# Patient Record
Sex: Male | Born: 1945 | Race: White | Hispanic: No | Marital: Married | Smoking: Former smoker
Health system: Southern US, Community
[De-identification: ages and names within clinical notes are randomized; demographics above are authoritative.]

## PROBLEM LIST (undated history)

## (undated) DIAGNOSIS — E079 Disorder of thyroid, unspecified: Secondary | ICD-10-CM

## (undated) DIAGNOSIS — I10 Essential (primary) hypertension: Secondary | ICD-10-CM

## (undated) DIAGNOSIS — K219 Gastro-esophageal reflux disease without esophagitis: Secondary | ICD-10-CM

## (undated) HISTORY — DX: Essential (primary) hypertension: I10

## (undated) HISTORY — DX: Gastro-esophageal reflux disease without esophagitis: K21.9

## (undated) HISTORY — DX: Disorder of thyroid, unspecified: E07.9

---

## 2001-09-29 ENCOUNTER — Emergency Department (HOSPITAL_COMMUNITY): Admission: EM | Admit: 2001-09-29 | Discharge: 2001-09-29 | Payer: Self-pay | Admitting: Emergency Medicine

## 2001-09-29 ENCOUNTER — Encounter: Payer: Self-pay | Admitting: Emergency Medicine

## 2001-10-01 ENCOUNTER — Emergency Department (HOSPITAL_COMMUNITY): Admission: EM | Admit: 2001-10-01 | Discharge: 2001-10-01 | Payer: Self-pay | Admitting: Emergency Medicine

## 2001-10-11 ENCOUNTER — Emergency Department (HOSPITAL_COMMUNITY): Admission: EM | Admit: 2001-10-11 | Discharge: 2001-10-11 | Payer: Self-pay | Admitting: Emergency Medicine

## 2005-11-01 ENCOUNTER — Encounter: Admission: RE | Admit: 2005-11-01 | Discharge: 2005-11-01 | Payer: Self-pay | Admitting: Cardiology

## 2006-07-03 ENCOUNTER — Encounter (INDEPENDENT_AMBULATORY_CARE_PROVIDER_SITE_OTHER): Payer: Self-pay | Admitting: Specialist

## 2006-07-03 ENCOUNTER — Ambulatory Visit (HOSPITAL_COMMUNITY): Admission: RE | Admit: 2006-07-03 | Discharge: 2006-07-03 | Payer: Self-pay | Admitting: General Surgery

## 2006-12-12 HISTORY — PX: COLONOSCOPY: SHX174

## 2006-12-12 HISTORY — PX: POLYPECTOMY: SHX149

## 2007-03-07 ENCOUNTER — Ambulatory Visit: Payer: Self-pay | Admitting: Gastroenterology

## 2007-04-19 ENCOUNTER — Ambulatory Visit: Payer: Self-pay | Admitting: Gastroenterology

## 2007-04-19 ENCOUNTER — Encounter (INDEPENDENT_AMBULATORY_CARE_PROVIDER_SITE_OTHER): Payer: Self-pay | Admitting: Gastroenterology

## 2008-01-21 ENCOUNTER — Encounter: Admission: RE | Admit: 2008-01-21 | Discharge: 2008-01-21 | Payer: Self-pay | Admitting: Family Medicine

## 2008-12-12 HISTORY — PX: CHOLECYSTECTOMY: SHX55

## 2010-12-21 ENCOUNTER — Encounter (HOSPITAL_COMMUNITY)
Admission: RE | Admit: 2010-12-21 | Discharge: 2011-01-11 | Payer: Self-pay | Source: Home / Self Care | Attending: Endocrinology | Admitting: Endocrinology

## 2011-01-01 ENCOUNTER — Encounter: Payer: Self-pay | Admitting: Endocrinology

## 2011-01-02 ENCOUNTER — Encounter: Payer: Self-pay | Admitting: Endocrinology

## 2011-01-07 ENCOUNTER — Ambulatory Visit (HOSPITAL_COMMUNITY)
Admission: RE | Admit: 2011-01-07 | Discharge: 2011-01-07 | Payer: Self-pay | Source: Home / Self Care | Attending: Endocrinology | Admitting: Endocrinology

## 2011-04-29 NOTE — Op Note (Signed)
Kevin Kemp, EARNSHAW                ACCOUNT NO.:  [id number]   MEDICAL RECORD NO.:  [id number]          PATIENT TYPE:  AMB   LOCATION:  DAY                          FACILITY:  [facility]   PHYSICIAN:  Ollen Gross. [name], M.D. DATE OF BIRTH:  [birth year]   DATE OF PROCEDURE:  07/03/2006  DATE OF DISCHARGE:                                 OPERATIVE REPORT   PREOPERATIVE DIAGNOSIS:  Gallstones.   POSTOPERATIVE DIAGNOSES:  Cholecystitis, cholelithiasis.   PROCEDURE:  Laparoscopic cholecystectomy with intraoperative cholangiogram.   SURGEON:  Ollen Gross. [name], M.D.   ASSISTANT:  Lebron Conners, M.D.   ANESTHESIA:  General endotracheal.   PROCEDURE:  After informed consent was obtained, the patient was brought to  the operating room and placed in the supine position on the operating table.  After adequate induction of general anesthesia, the patient's abdomen was  prepped with Betadine and draped in the usual sterile manner.  The area  below the umbilicus was infiltrated 0.25% Marcaine.  A small incision was  made with a 15 blade knife.  This incision was carried down through the  subcutaneous tissue bluntly with a hemostat and [employer] retractors until  the linea alba was identified.  The linea alba was incised with a 15-blade  knife and each side was grasped with Kocher clamps and elevated anteriorly.  The preperitoneal space was then probed bluntly with hemostat until the  peritoneum was opened, and access was gained into the abdominal cavity.  A 0  Vicryl pursestring stitch was placed in the fascia around the opening.  An  Hasson cannula was placed through the opening and anchored in place with the  previously placed 0 Vicryl pursestring stitch.  The abdomen was then  insufflated with carbon dioxide without difficulty.  The patient was placed  in head-up position and rotated slightly with the right side up.  Next, the  laparoscope was inserted through the Hasson cannula, and the right  quadrant  was inspected.  The dome of the gallbladder and liver readily identified.  Next, the epigastric region was infiltrated with 4% Marcaine.  A small  incision was made with a 15-blade knife, and a 10-mm port was placed bluntly  through this incision into the abdominal cavity under direct vision.  Sites  were then chosen laterally on the right side of the abdomen for placement of  5-mm ports.  Each of these areas was infiltrated with 0.25% Marcaine.  Small  stab incisions were made with a 15-blade knife and 5-mm ports were placed  bluntly through these incisions into the abdominal cavity under direct  vision.  A blunt grasper was placed through the lateral-most 5-mm port and  used to grasp the dome of gallbladder and elevate it anteriorly and  superiorly.  Another blunt grasper was placed through the other 5-mm port  and used to retract on the body and neck of the gallbladder.  The duodenum  appeared to have some adhesions to the body of the gallbladder, and these  were taken down sharply with the laparoscopic scissors.  A dissector was  then placed  through the epigastric port, and using the electrocautery, the  peritoneal reflection at the gallbladder neck area was opened.  Blunt  dissection was carried out in this area until the gallbladder neck and  cystic duct junction was readily identified, and a good window was created.  A single clip placed on the gallbladder neck.  Small ductotomy was made just  below the clip.  A 14-gauge Angiocath was then placed percutaneously through  the anterior abdominal wall under direct vision.  A Reddick cholangiogram  catheter was placed through the Angiocath and flushed.  The Reddick catheter  was then placed in the cystic duct and anchored into place with a clip.  A  cholangiogram was obtained, and that showed no filling defects, good  emptying in the duodenum, and adequate length of the cystic duct.  The  anchoring clip and catheters were then  removed from the patient.  Three  clips were placed proximally in the cystic duct, and the duct was divided  between the two sets of clips.  Posterior to this, the cystic artery was  identified with a branch.  Each of these branches was dissected carefully  circumferentially with a blunt dissector until a good window was created.  Two clips were placed proximally and one distally on each of the branches,  and the branches were divided between the two sets of clips.  Next, a  laparoscopic hook cautery device was used to separate the gallbladder from  the liver bed.  Prior to completely detaching the gallbladder from the liver  bed, the liver bed was inspected, and several small bleeding points were  coagulated with the electrocautery.  The gallbladder was then detached the  rest of the way from the liver bed without difficulty.  During the  retraction of the gallbladder, a small tear was made at the liver at the  edge of the falciform.  This was cauterized and at the end of the case was  completely hemostatic.  A laparoscopic bag was inserted through the  epigastric port.  The gallbladder was placed within the bag, and the bag was  sealed.  The abdomen was then irrigated with copious amounts of saline until  the effluent was clear.  Surgicel was placed in the bed of the liver and at  the tear at the falciform ligament.  The laparoscope was then moved to the  epigastric port.  A gallbladder grasper was placed through the [facility]  cannula and used to grasp the open end of the bag.  The bag with the  gallbladder was removed with the [facility] cannula through the infraumbilical  port without difficulty.  The fascial defect was closed with the previously  placed Vicryl pursestring stitch as well as another interrupted 0 Vicryl  figure-of-eight stitch.  The rest of the ports were removed under direct  vision and found to be hemostatic.  The gas was allowed to escape.  The skin incisions were all  closed with interrupted for Monocryl subcuticular  stitches.  Benzoin and Steri-Strips were applied.  The patient tolerated the  procedure well.  At the end of the case, all needle, sponge, and instrument  counts were correct.  The patient was then awakened and taken to the  recovery room in stable condition.      Ollen Gross. [name], M.D.  Electronically Signed     PST/MEDQ  D:  07/03/2006  T:  07/03/2006  Job:  [id number]

## 2011-04-29 NOTE — Assessment & Plan Note (Signed)
[facility]                         GASTROENTEROLOGY OFFICE NOTE   NAME:Kevin Kemp, Kevin Kemp                       MRN:          [id number]  DATE:03/07/2007                            DOB:          09-09-1946    This is a new office visit for Mr. Hemp who has a long history of acid  reflux.  He says Protonix helps.  He does have some intermittent  abdominal pain as well.  He has been having this for a year, no nausea,  vomiting, does not have any diarrhea or constipation, has had no blood  in his stool.  His last upper endoscopy was 2003 at which time he had a  significant hiatal hernia, and he was dilated with a 54 Maloney dilator.  He did have some mild gastritis as well.   FAMILY HISTORY:  Noncontributory.   SOCIAL HISTORY:  Noncontributory.   PAST MEDICAL HISTORY:  Reveals that he does have some hypertension, and  he is status post cholecystectomy, and has otherwise been in good  health.  He does not drink or smoke.   REVIEW OF SYSTEMS:  Noncontributory.   PHYSICAL EXAMINATION:  He is 6 feet, weighs 187, blood pressure 130/70,  pulse 68 and regular.  Neck, heart and extremities are all unremarkable.   IMPRESSION:  1. Gastroesophageal reflux disease with some dysphagia, on Protonix      daily.  2. Hypertension.  3. Abdominal pain over the past year, intermittently, of questionable      etiology.  4. Status post cholecystectomy.   RECOMMENDATIONS:  Keep him on his Protonix, I gave him samples of this.  We scheduled for an upper endo and colonoscopy examination sometime in  the near future.     Ulyess Mort, MD  Electronically Signed    SML/MedQ  DD: 03/07/2007  DT: 03/07/2007  Job #: [id number]   cc:   Tammy R. Collins [place], M.D.

## 2011-06-28 ENCOUNTER — Encounter: Payer: Self-pay | Admitting: Internal Medicine

## 2012-08-30 ENCOUNTER — Encounter: Payer: Self-pay | Admitting: Internal Medicine

## 2014-10-29 ENCOUNTER — Encounter: Payer: Self-pay | Admitting: Internal Medicine

## 2015-01-01 ENCOUNTER — Encounter: Payer: Self-pay | Admitting: Internal Medicine

## 2015-11-20 ENCOUNTER — Encounter: Payer: Self-pay | Admitting: Internal Medicine

## 2016-01-07 ENCOUNTER — Ambulatory Visit (AMBULATORY_SURGERY_CENTER): Payer: Self-pay

## 2016-01-07 VITALS — Ht 72.0 in | Wt 189.0 lb

## 2016-01-07 DIAGNOSIS — Z86010 Personal history of colonic polyps: Secondary | ICD-10-CM

## 2016-01-07 MED ORDER — NA SULFATE-K SULFATE-MG SULF 17.5-3.13-1.6 GM/177ML PO SOLN: 1.0000 | Freq: Once | ORAL | Status: DC

## 2016-01-07 NOTE — Progress Notes (Signed)
No egg or soy allergies Not on home 02 No previous anesthesia complications No diet or weight loss meds 

## 2016-01-21 ENCOUNTER — Encounter: Payer: Self-pay | Admitting: Internal Medicine

## 2016-01-21 ENCOUNTER — Ambulatory Visit (AMBULATORY_SURGERY_CENTER): Payer: Medicare Other | Admitting: Internal Medicine

## 2016-01-21 VITALS — BP 118/69 | HR 39 | Temp 96.9°F | Resp 19 | Ht 72.0 in | Wt 189.0 lb

## 2016-01-21 DIAGNOSIS — Z1211 Encounter for screening for malignant neoplasm of colon: Secondary | ICD-10-CM

## 2016-01-21 DIAGNOSIS — K5730 Diverticulosis of large intestine without perforation or abscess without bleeding: Secondary | ICD-10-CM | POA: Diagnosis not present

## 2016-01-21 DIAGNOSIS — Z86010 Personal history of colonic polyps: Secondary | ICD-10-CM

## 2016-01-21 MED ORDER — SODIUM CHLORIDE 0.9 % IV SOLN: 500.0000 mL | INTRAVENOUS | Status: DC

## 2016-01-21 NOTE — Op Note (Signed)
[facility] [address] [place], [address]   COLONOSCOPY PROCEDURE REPORT  PATIENT: Kevin Kemp, Kevin Kemp  MR#: [id number] BIRTHDATE: 1946-04-14 , 69  yrs. old GENDER: male ENDOSCOPIST: Roxy Cedar, MD REFERRED MW:[id number] Program Recall PROCEDURE DATE:  01/21/2016 PROCEDURE:   Colonoscopy, surveillance First Screening Colonoscopy - Avg.  risk and is 50 yrs.  old or older - No.  Prior Negative Screening - Now for repeat screening. N/A  History of Adenoma - Now for follow-up colonoscopy & has been > or = to 3 yrs.  N/A  Polyps removed today? No Recommend repeat exam, <10 yrs? No ASA CLASS:   Class II INDICATIONS:Surveillance due to prior colonic neoplasia and Patient is not applicable for Colorectal Neoplasm Risk Assessment for this procedure.  . Index examination May 2008 with Dr. Corinda Gubler. Multiple diminutive polyps. Some with pathology (hyperplastic) others without pathology MEDICATIONS: Monitored anesthesia care and Propofol 200 mg IV  DESCRIPTION OF PROCEDURE:   After the risks benefits and alternatives of the procedure were thoroughly explained, informed consent was obtained.  The digital rectal exam revealed no abnormalities of the rectum.   The LB [id number]  endoscope was introduced through the anus and advanced to the cecum, which was identified by both the appendix and ileocecal valve. No adverse events experienced.   The quality of the prep was excellent. (Suprep was used)  The instrument was then slowly withdrawn as the colon was fully examined. Estimated blood loss is zero unless otherwise noted in this procedure report.      COLON FINDINGS: There was mild diverticulosis noted in the ascending colon and sigmoid colon.   The examination was otherwise normal. Retroflexed views revealed no abnormalities. The time to cecum = 2.4 Withdrawal time = 7.8   The scope was withdrawn and the procedure completed. COMPLICATIONS: There were no  immediate complications.  ENDOSCOPIC IMPRESSION: 1.   Mild diverticulosis was noted in the ascending colon and sigmoid colon 2.   The examination was otherwise normal  RECOMMENDATIONS: Continue current colorectal screening recommendations for "routine risk" patients with a repeat colonoscopy in 10 years.  eSigned:  Roxy Cedar, MD 01/21/2016 12:39 PM   cc: The Patient and Kathlen Brunswick, NP [phone])

## 2016-01-21 NOTE — Progress Notes (Signed)
Report to PACU, RN, vss, BBS= Clear.  

## 2016-01-21 NOTE — Progress Notes (Signed)
Patient is a poor historian regarding family history.

## 2016-01-21 NOTE — Patient Instructions (Signed)

## 2016-01-22 ENCOUNTER — Telehealth: Payer: Self-pay

## 2016-01-22 NOTE — Telephone Encounter (Signed)
  Follow up Call-  Call back number 01/21/2016  Post procedure Call Back phone  # [phone]  Permission to leave phone message Yes    Questions answered by his wife.   Patient questions:  Do you have a fever, pain , or abdominal swelling? No. Pain Score  0 *  Have you tolerated food without any problems? Yes.    Have you been able to return to your normal activities? Yes.    Do you have any questions about your discharge instructions: Diet   No. Medications  No. Follow up visit  No.  Do you have questions or concerns about your Care? No.  Actions: * If pain score is 4 or above: No action needed, pain <4.

## 2020-12-09 ENCOUNTER — Emergency Department (HOSPITAL_COMMUNITY): Payer: Medicare Other

## 2020-12-09 ENCOUNTER — Inpatient Hospital Stay (HOSPITAL_COMMUNITY)
Admission: EM | Admit: 2020-12-09 | Discharge: 2020-12-11 | DRG: 392 | Disposition: A | Discharge status: Home / Self Care | Payer: Medicare Other | Source: Ambulatory Visit | Attending: Internal Medicine | Admitting: Internal Medicine

## 2020-12-09 ENCOUNTER — Encounter (HOSPITAL_COMMUNITY): Payer: Self-pay

## 2020-12-09 ENCOUNTER — Other Ambulatory Visit: Payer: Self-pay

## 2020-12-09 DIAGNOSIS — R1031 Right lower quadrant pain: Secondary | ICD-10-CM

## 2020-12-09 DIAGNOSIS — F0280 Dementia in other diseases classified elsewhere without behavioral disturbance: Secondary | ICD-10-CM | POA: Diagnosis present

## 2020-12-09 DIAGNOSIS — Z8249 Family history of ischemic heart disease and other diseases of the circulatory system: Secondary | ICD-10-CM | POA: Diagnosis not present

## 2020-12-09 DIAGNOSIS — Z79899 Other long term (current) drug therapy: Secondary | ICD-10-CM

## 2020-12-09 DIAGNOSIS — F0391 Unspecified dementia with behavioral disturbance: Secondary | ICD-10-CM | POA: Diagnosis not present

## 2020-12-09 DIAGNOSIS — N179 Acute kidney failure, unspecified: Secondary | ICD-10-CM | POA: Diagnosis present

## 2020-12-09 DIAGNOSIS — Z79890 Hormone replacement therapy (postmenopausal): Secondary | ICD-10-CM | POA: Diagnosis not present

## 2020-12-09 DIAGNOSIS — E039 Hypothyroidism, unspecified: Secondary | ICD-10-CM | POA: Diagnosis present

## 2020-12-09 DIAGNOSIS — I10 Essential (primary) hypertension: Secondary | ICD-10-CM | POA: Diagnosis present

## 2020-12-09 DIAGNOSIS — F0390 Unspecified dementia without behavioral disturbance: Secondary | ICD-10-CM | POA: Diagnosis not present

## 2020-12-09 DIAGNOSIS — G309 Alzheimer's disease, unspecified: Secondary | ICD-10-CM | POA: Diagnosis present

## 2020-12-09 DIAGNOSIS — R109 Unspecified abdominal pain: Secondary | ICD-10-CM | POA: Diagnosis present

## 2020-12-09 DIAGNOSIS — K219 Gastro-esophageal reflux disease without esophagitis: Secondary | ICD-10-CM | POA: Diagnosis present

## 2020-12-09 DIAGNOSIS — Z20822 Contact with and (suspected) exposure to covid-19: Secondary | ICD-10-CM | POA: Diagnosis present

## 2020-12-09 DIAGNOSIS — R001 Bradycardia, unspecified: Secondary | ICD-10-CM | POA: Diagnosis present

## 2020-12-09 DIAGNOSIS — Z87891 Personal history of nicotine dependence: Secondary | ICD-10-CM | POA: Diagnosis not present

## 2020-12-09 DIAGNOSIS — E876 Hypokalemia: Secondary | ICD-10-CM | POA: Diagnosis present

## 2020-12-09 DIAGNOSIS — K529 Noninfective gastroenteritis and colitis, unspecified: Secondary | ICD-10-CM | POA: Diagnosis present

## 2020-12-09 DIAGNOSIS — Z825 Family history of asthma and other chronic lower respiratory diseases: Secondary | ICD-10-CM

## 2020-12-09 LAB — CBC
HCT: 37.7 % — ABNORMAL LOW (ref 39.0–52.0)
Hemoglobin: 12.9 g/dL — ABNORMAL LOW (ref 13.0–17.0)
MCH: 30.5 pg (ref 26.0–34.0)
MCHC: 34.2 g/dL (ref 30.0–36.0)
MCV: 89.1 fL (ref 80.0–100.0)
Platelets: 148 10*3/uL — ABNORMAL LOW (ref 150–400)
RBC: 4.23 MIL/uL (ref 4.22–5.81)
RDW: 11.9 % (ref 11.5–15.5)
WBC: 9.8 10*3/uL (ref 4.0–10.5)
nRBC: 0 % (ref 0.0–0.2)

## 2020-12-09 LAB — COMPREHENSIVE METABOLIC PANEL
ALT: 20 U/L (ref 0–44)
AST: 29 U/L (ref 15–41)
Albumin: 3.8 g/dL (ref 3.5–5.0)
Alkaline Phosphatase: 38 U/L (ref 38–126)
Anion gap: 13 (ref 5–15)
BUN: 34 mg/dL — ABNORMAL HIGH (ref 8–23)
CO2: 27 mmol/L (ref 22–32)
Calcium: 9.5 mg/dL (ref 8.9–10.3)
Chloride: 94 mmol/L — ABNORMAL LOW (ref 98–111)
Creatinine, Ser: 2.13 mg/dL — ABNORMAL HIGH (ref 0.61–1.24)
GFR, Estimated: 32 mL/min — ABNORMAL LOW (ref >60)
Glucose, Bld: 118 mg/dL — ABNORMAL HIGH (ref 70–99)
Potassium: 3.7 mmol/L (ref 3.5–5.1)
Sodium: 134 mmol/L — ABNORMAL LOW (ref 135–145)
Total Bilirubin: 1.9 mg/dL — ABNORMAL HIGH (ref 0.3–1.2)
Total Protein: 7.6 g/dL (ref 6.5–8.1)

## 2020-12-09 LAB — URINALYSIS, ROUTINE W REFLEX MICROSCOPIC
Bilirubin Urine: NEGATIVE
Glucose, UA: NEGATIVE mg/dL
Hgb urine dipstick: NEGATIVE
Ketones, ur: NEGATIVE mg/dL
Nitrite: NEGATIVE
Protein, ur: 30 mg/dL — AB
Specific Gravity, Urine: 1.023 (ref 1.005–1.030)
pH: 5 (ref 5.0–8.0)

## 2020-12-09 LAB — LIPASE, BLOOD: Lipase: 19 U/L (ref 11–51)

## 2020-12-09 MED ORDER — SODIUM CHLORIDE 0.9 % IV SOLN: INTRAVENOUS | Status: AC

## 2020-12-09 MED ORDER — ACETAMINOPHEN 325 MG PO TABS: 650.0000 mg | ORAL_TABLET | Freq: Four times a day (QID) | ORAL | Status: DC | PRN

## 2020-12-09 MED ORDER — ACETAMINOPHEN 650 MG RE SUPP: 650.0000 mg | Freq: Four times a day (QID) | RECTAL | Status: DC | PRN

## 2020-12-09 MED ORDER — HEPARIN SODIUM (PORCINE) 5000 UNIT/ML IJ SOLN
5000.0000 [IU] | Freq: Three times a day (TID) | INTRAMUSCULAR | Status: DC
  Administered 2020-12-10: 5000 [IU] via SUBCUTANEOUS
  Filled 2020-12-09 (×4): qty 1

## 2020-12-09 MED ORDER — SODIUM CHLORIDE 0.9 % IV BOLUS
1000.0000 mL | Freq: Once | INTRAVENOUS | Status: AC
  Administered 2020-12-09: 1000 mL via INTRAVENOUS

## 2020-12-09 MED ORDER — PIPERACILLIN-TAZOBACTAM 3.375 G IVPB
3.3750 g | Freq: Three times a day (TID) | INTRAVENOUS | Status: DC
  Administered 2020-12-10 – 2020-12-11 (×4): 3.375 g via INTRAVENOUS
  Filled 2020-12-09 (×5): qty 50

## 2020-12-09 MED ORDER — PIPERACILLIN-TAZOBACTAM 3.375 G IVPB 30 MIN
3.3750 g | Freq: Once | INTRAVENOUS | Status: AC
  Administered 2020-12-09: 3.375 g via INTRAVENOUS
  Filled 2020-12-09: qty 50

## 2020-12-09 NOTE — ED Provider Notes (Signed)
Medical screening examination/treatment/procedure(s) were conducted as a shared visit with non-physician practitioner(s) and myself.  I personally evaluated the patient during the encounter. Briefly, the patient is a 74 y.o. male with history of dementia who presents the ED with abdominal pain.  Poor p.o. intake for last several days.  Having some right lower abdominal pain.  Overall vitals unremarkable.  Urinalysis negative for infection does have an AKI of 2.1.  No white count, no fever.  CT scan abdomen pelvis shows mesenteric inflammatory changes in the right lower quadrant.  Could be some form of ileal jejunal diverticulitis or Meckel's diverticulum.  Given AKI and these changes will start IV fluids, IV antibiotics and touch base with surgery and have admitted to medicine.  This chart was dictated using voice recognition software.  Despite best efforts to proofread,  errors can occur which can change the documentation meaning.     EKG Interpretation None          Virgina Norfolk, DO 12/09/20 2015

## 2020-12-09 NOTE — H&P (Signed)
History and Physical    JERSEY RAVENSCROFT MCN:[id number] DOB: July 06, 1946 DOA: 12/09/2020  PCP: April Manson, NP Patient coming from: Home  Chief Complaint: Abdominal pain, hypotension  HPI: Kevin Kemp is a 74 y.o. male with medical history significant of hypertension, hypothyroidism, GERD, surgical history of cholecystectomy presenting to the ED for evaluation of abdominal pain and hypotension.  Patient's wife reported to ED provider that he has a history of Alzheimer's.  He has been complaining of lower abdominal pain for the past 2 days and not eating/drinking much.  He has not had any episodes of vomiting, however, has had multiple episodes of nonbloody diarrhea.  No recent antibiotic use, no recent change in medications, and no sick contacts reported.  He is fully vaccinated against Covid.  He was seen at urgent care earlier and was sent to the ED to be evaluated due to low blood pressure with systolic in the 100s.  Patient reports right lower quadrant abdominal pain.  No additional history could be obtained from him.  ED Course: Afebrile.  Slightly bradycardic with heart rate 55-60.  Not hypotensive.  Labs showing WBC 9.8, hemoglobin 12.9, hematocrit 37.7, platelet 148K.  Sodium 134, potassium 3.7, chloride 94, bicarb 27, BUN 34, creatinine 2.1, glucose 118.  T bili 1.9, remainder of LFTs normal.  Lipase normal.  UA with negative nitrite, trace amount of leukocytes, 6-10 WBCs, and rare bacteria.  Urine culture pending.  Screening SARS-CoV-2 PCR test pending.  CT abdomen pelvis showing mesenteric inflammatory changes in the right lower quadrant with mildly enlarged lymph nodes.  Mild dilation of adjacent small bowel loops and some form of ileal or jejunal diverticulitis may be present.  Meckel's diverticulum may be a possibility.  General surgery was consulted and saw the patient.  Recommended medical admission for fluids, antibiotics, and serial abdominal exams.  General surgery will continue  to consult.  If pain worsens or patient develops fever/leukocytosis, diagnostic laparoscopy will be considered.  Patient was given Zosyn and 1 L normal saline bolus.  Review of Systems:  All systems reviewed and apart from history of presenting illness, are negative.  Past Medical History:  Diagnosis Date  . GERD (gastroesophageal reflux disease)   . Hypertension   . Thyroid disease     Past Surgical History:  Procedure Laterality Date  . CHOLECYSTECTOMY  2010  . COLONOSCOPY  2008  . POLYPECTOMY  2008     reports that he quit smoking about 7 years ago. His smoking use included cigarettes. He has a 50.00 pack-year smoking history. He has never used smokeless tobacco. He reports that he does not drink alcohol and does not use drugs.  No Known Allergies  Family History  Problem Relation Age of Onset  . Colon cancer Neg Hx   . Asthma Mother   . Heart attack Father   . Heart disease Paternal Uncle     Prior to Admission medications   Medication Sig Start Date End Date Taking? Authorizing Provider  hydrochlorothiazide (HYDRODIURIL) 25 MG tablet Take 25 mg by mouth daily.    [provider]  levothyroxine (SYNTHROID, LEVOTHROID) 50 MCG tablet Take 50 mcg by mouth daily before breakfast.    [provider]  losartan (COZAAR) 100 MG tablet Take 100 mg by mouth daily.    [provider]  Omega-3 Fatty Acids (FISH OIL) 1200 MG CAPS Take by mouth.    [provider]  omeprazole (PRILOSEC) 20 MG capsule Take 20 mg by mouth  daily.    [provider]    Physical Exam: Vitals:   12/09/20 1418 12/09/20 1540 12/09/20 1834  BP: 104/65 111/66 125/67  Pulse: (!) 59 61 (!) 55  Resp: 16 17 16   Temp: 98.6 F (37 C)  98.3 F (36.8 C)  TempSrc: Oral  Oral  SpO2: 100% 99% 98%    Physical Exam Constitutional:      General: He is not in acute distress. HENT:     Head: Normocephalic and atraumatic.  Eyes:     Extraocular Movements:  Extraocular movements intact.     Conjunctiva/sclera: Conjunctivae normal.  Cardiovascular:     Rate and Rhythm: Normal rate and regular rhythm.     Pulses: Normal pulses.  Pulmonary:     Effort: Pulmonary effort is normal. No respiratory distress.     Breath sounds: No wheezing.  Abdominal:     General: Bowel sounds are normal. There is no distension.     Palpations: Abdomen is soft.     Tenderness: There is abdominal tenderness. There is guarding. There is no rebound.     Comments: Tenderness at McBurney's point  Musculoskeletal:        General: No swelling or tenderness.     Cervical back: Normal range of motion and neck supple.  Skin:    General: Skin is warm and dry.  Neurological:     General: No focal deficit present.     Mental Status: He is alert.     Labs on Admission: I have personally reviewed following labs and imaging studies  CBC: Recent Labs  Lab 12/09/20 1420  WBC 9.8  HGB 12.9*  HCT 37.7*  MCV 89.1  PLT 148*   Basic Metabolic Panel: Recent Labs  Lab 12/09/20 1420  NA 134*  K 3.7  CL 94*  CO2 27  GLUCOSE 118*  BUN 34*  CREATININE 2.13*  CALCIUM 9.5   GFR: CrCl cannot be calculated (Unknown ideal weight.). Liver Function Tests: Recent Labs  Lab 12/09/20 1420  AST 29  ALT 20  ALKPHOS 38  BILITOT 1.9*  PROT 7.6  ALBUMIN 3.8   Recent Labs  Lab 12/09/20 1420  LIPASE 19   No results for input(s): AMMONIA in the last 168 hours. Coagulation Profile: No results for input(s): INR, PROTIME in the last 168 hours. Cardiac Enzymes: No results for input(s): CKTOTAL, CKMB, CKMBINDEX, TROPONINI in the last 168 hours. BNP (last 3 results) No results for input(s): PROBNP in the last 8760 hours. HbA1C: No results for input(s): HGBA1C in the last 72 hours. CBG: No results for input(s): GLUCAP in the last 168 hours. Lipid Profile: No results for input(s): CHOL, HDL, LDLCALC, TRIG, CHOLHDL, LDLDIRECT in the last 72 hours. Thyroid Function  Tests: No results for input(s): TSH, T4TOTAL, FREET4, T3FREE, THYROIDAB in the last 72 hours. Anemia Panel: No results for input(s): VITAMINB12, FOLATE, FERRITIN, TIBC, IRON, RETICCTPCT in the last 72 hours. Urine analysis:    Component Value Date/Time   COLORURINE AMBER (A) 12/09/2020 1420   APPEARANCEUR HAZY (A) 12/09/2020 1420   LABSPEC 1.023 12/09/2020 1420   PHURINE 5.0 12/09/2020 1420   GLUCOSEU NEGATIVE 12/09/2020 1420   HGBUR NEGATIVE 12/09/2020 1420   BILIRUBINUR NEGATIVE 12/09/2020 1420   KETONESUR NEGATIVE 12/09/2020 1420   PROTEINUR 30 (A) 12/09/2020 1420   NITRITE NEGATIVE 12/09/2020 1420   LEUKOCYTESUR TRACE (A) 12/09/2020 1420    Radiological Exams on Admission: CT ABDOMEN PELVIS WO CONTRAST  Result Date: 12/09/2020 CLINICAL DATA:  Acute right flank pain. EXAM: CT ABDOMEN AND PELVIS WITHOUT CONTRAST TECHNIQUE: Multidetector CT imaging of the abdomen and pelvis was performed following the standard protocol without IV contrast. COMPARISON:  May 25, 2006. FINDINGS: Lower chest: No acute abnormality. Hepatobiliary: No focal liver abnormality is seen. Status post cholecystectomy. No biliary dilatation. Pancreas: Unremarkable. No pancreatic ductal dilatation or surrounding inflammatory changes. Spleen: Normal in size without focal abnormality. Adrenals/Urinary Tract: Adrenal glands are unremarkable. Kidneys are normal, without renal calculi, focal lesion, or hydronephrosis. Bladder is unremarkable. Stomach/Bowel: Stomach is within normal limits. Appendix appears normal. There is no evidence of bowel obstruction. However, mesenteric inflammatory changes are noted in the right lower quadrant with mildly enlarged lymph nodes present. There does appear to be mild dilatation of adjacent small bowel loops, and some form of ileal or jejunal diverticulitis may be present. Meckel's diverticulum may be a possibility. Vascular/Lymphatic: Atherosclerosis of abdominal aorta is noted without  aneurysm formation. Reproductive: Prostate is unremarkable. Other: No abdominal wall hernia or abnormality. No abdominopelvic ascites. Musculoskeletal: No acute or significant osseous findings. IMPRESSION: 1. Mesenteric inflammatory changes are noted in the right lower quadrant with mildly enlarged lymph nodes present. There does appear to be mild dilatation of adjacent small bowel loops, and some form of ileal or jejunal diverticulitis may be present. Meckel's diverticulum may be a possibility. 2. Aortic atherosclerosis. Aortic Atherosclerosis (ICD10-I70.0). Electronically Signed   By: Lupita Raider M.D.   On: 12/09/2020 19:38    Assessment/Plan Principal Problem:   Abdominal pain Active Problems:   AKI (acute kidney injury) (HCC)   Bradycardia   HTN (hypertension)   Hypothyroidism   Right lower quadrant abdominal pain, diarrhea: Patient here for evaluation of right lower quadrant abdominal pain, poor oral intake, and nonbloody diarrhea.  No fever, tachycardia, or leukocytosis to suggest sepsis. CT abdomen pelvis showing mesenteric inflammatory changes in the right lower quadrant with mildly enlarged lymph nodes.  Mild dilation of adjacent small bowel loops and some form of ileal or jejunal diverticulitis may be present.  Meckel's diverticulum may be a possibility. General surgery was consulted and saw the patient.  Recommended medical admission for fluids, antibiotics, and serial abdominal exams.  General surgery will continue to consult.  If pain worsens or patient develops fever/leukocytosis, diagnostic laparoscopy will be considered. -IV fluid hydration, continue Zosyn, serial abdominal exams.  C. difficile PCR and GI pathogen panel, enteric precautions.  Continue to monitor WBC count.  Check lactic acid level.  AKI: Likely prerenal due to poor oral intake and home hydrochlorothiazide plus losartan use.  BUN 34, creatinine 2.1.  Per care everywhere, creatinine was 1.3 in July 2021.  No recent  labs for comparison. -IV fluid hydration.  Monitor renal function and urine output.  Hold home hydrochlorothiazide and losartan, avoid nephrotoxic agents.  Check urine sodium and creatinine.  Order renal ultrasound.  Bradycardia: Slightly bradycardic with heart rate 55-60.  Not on any AV nodal blocking agent.  -Cardiac monitoring, order EKG, check TSH level  Possible UTI: UA with negative nitrite, trace amount of leukocytes, 6-10 WBCs, and rare bacteria.  No fever, tachycardia, or leukocytosis to suggest sepsis. -Continue Zosyn as mentioned above.  Urine culture pending.  Elevated T bili: T bili 1.9, remainder of LFTs normal.  Bilirubin was previously normal per records under Care Everywhere.  History of cholecystectomy and no biliary dilatation seen on CT. -Check fractionated bilirubin levels  Hypertension -Hold antihypertensives at this time as blood pressure was low at urgent care.  Most recent blood pressure 125/67.  Check orthostatics and continue to monitor closely.  Hypothyroidism -Check TSH level.  Resume Synthroid after pharmacy med rec is done.  GERD -Resume PPI after pharmacy med rec is done.  DVT prophylaxis: Subcutaneous heparin Code Status: Full code Family Communication: No family available at this time. Disposition Plan: Status is: Inpatient  Remains inpatient appropriate because:Ongoing diagnostic testing needed not appropriate for outpatient work up, IV treatments appropriate due to intensity of illness or inability to take PO and Inpatient level of care appropriate due to severity of illness   Dispo: The patient is from: Home              Anticipated d/c is to: Home              Anticipated d/c date is: 3 days              Patient currently is not medically stable to d/c.  The medical decision making on this patient was of high complexity and the patient is at high risk for clinical deterioration, therefore this is a level 3 visit.  John GiovanniVasundhra Trew Sunde MD [facility]  If 7PM-7AM, please contact night-coverage [email or web address]  12/09/2020, 11:39 PM

## 2020-12-09 NOTE — Progress Notes (Signed)
Pharmacy Antibiotic Note  Kevin Kemp is a 74 y.o. male admitted on 12/09/2020 with intra-abdominal infection.  Pharmacy has been consulted for Zosyn dosing. WBC WNL. Noted renal dysfunction. Surgery following.   Plan: Zosyn 3.375G IV q8h to be infused over 4 hours Trend WBC, temp, renal function  F/U infectious work-up  Temp (24hrs), Avg:98.5 F (36.9 C), Min:98.3 F (36.8 C), Max:98.6 F (37 C)  Recent Labs  Lab 12/09/20 1420  WBC 9.8  CREATININE 2.13*    CrCl cannot be calculated (Unknown ideal weight.).    No Known Allergies  Abran Duke, PharmD, BCPS Clinical Pharmacist Phone: [phone]

## 2020-12-09 NOTE — ED Triage Notes (Signed)
Pt accompanied by wife who reports right flank pain since Monday, no n/v. Sent by UC for further eval due to hypotension, BP 104/65 in triage. Pt alert, hx of dementia.

## 2020-12-09 NOTE — ED Provider Notes (Signed)
[name] EMERGENCY DEPARTMENT Provider Note   CSN: [id number] Arrival date & time: 12/09/20  1315     History Chief Complaint  Patient presents with  . Flank Pain    Kevin Kemp is a 74 y.o. male.  74 y.o male with a past medical history of hypertension presents to the ED with a chief complaint of abdominal pain.  Primary history was obtained from the wife as patient does have a history of Alzheimer's.  Wife reports patient has not been eating, drinking, has been eating less at night, all of this again 2 days ago.  She reports patient grabbing at his lower abdomen, was seen at urgent care earlier and had pain with palpation of the right lower quadrant.  She reports multiple episodes of diarrhea from patient yesterday but without any blood in his stool.  Ports patient has not been complaining of anything aside from lack of appetite.  There has been no recent medication changes and patient has a follow-up appointment with his PCP next week.  Has been no blood in his stool, urine,no fever other complaints from patient. Prior history of a cholecystectomy reported.  The history is provided by the patient. The history is limited by the condition of the patient.       Past Medical History:  Diagnosis Date  . GERD (gastroesophageal reflux disease)   . Hypertension   . Thyroid disease     Patient Active Problem List   Diagnosis Date Noted  . Abdominal pain 12/09/2020    Past Surgical History:  Procedure Laterality Date  . CHOLECYSTECTOMY  2010  . COLONOSCOPY  2008  . POLYPECTOMY  2008       Family History  Problem Relation Age of Onset  . Colon cancer Neg Hx   . Asthma Mother   . Heart attack Father   . Heart disease Paternal Uncle     Social History   Tobacco Use  . Smoking status: Former Smoker    Packs/day: 1.00    Years: 50.00    Pack years: 50.00    Types: Cigarettes    Quit date: 01/06/2013    Years since quitting: 7.9  . Smokeless  tobacco: Never Used  Substance Use Topics  . Alcohol use: No    Alcohol/week: 0.0 standard drinks  . Drug use: No    Home Medications Prior to Admission medications   Medication Sig Start Date End Date Taking? Authorizing Provider  hydrochlorothiazide (HYDRODIURIL) 25 MG tablet Take 25 mg by mouth daily.    [provider]  levothyroxine (SYNTHROID, LEVOTHROID) 50 MCG tablet Take 50 mcg by mouth daily before breakfast.    [provider]  losartan (COZAAR) 100 MG tablet Take 100 mg by mouth daily.    [provider]  Omega-3 Fatty Acids (FISH OIL) 1200 MG CAPS Take by mouth.    [provider]  omeprazole (PRILOSEC) 20 MG capsule Take 20 mg by mouth daily.    [provider]    Allergies    Patient has no known allergies.  Review of Systems   Review of Systems  Constitutional: Negative for chills and fever.  HENT: Negative for sore throat.   Respiratory: Negative for shortness of breath.   Cardiovascular: Negative for chest pain.  Gastrointestinal: Positive for abdominal pain, diarrhea and nausea. Negative for blood in stool, constipation and vomiting.  Genitourinary: Negative for dysuria, flank pain and hematuria.  Musculoskeletal: Negative for back pain.  All  other systems reviewed and are negative.   Physical Exam Updated Vital Signs BP 125/67 (BP Location: Left Arm)   Pulse (!) 55   Temp 98.3 F (36.8 C) (Oral)   Resp 16   SpO2 98%   Physical Exam Vitals and nursing note reviewed.  Constitutional:      General: He is not in acute distress.    Appearance: Normal appearance. He is not ill-appearing.  HENT:     Head: Normocephalic and atraumatic.     Mouth/Throat:     Mouth: Mucous membranes are moist.  Pulmonary:     Effort: Pulmonary effort is normal.     Breath sounds: No wheezing or rales.  Abdominal:     General: Abdomen is flat.     Tenderness: There is abdominal tenderness in the right lower quadrant and  suprapubic area. There is rebound. There is no right CVA tenderness, left CVA tenderness or guarding. Positive signs include McBurney's sign.     Comments: Bowel sounds are diminished.  There is tenderness to palpation along the right lower quadrant and suprapubic region.  No guarding noted but some rebound tenderness.  Musculoskeletal:        General: No tenderness or deformity.     Cervical back: Normal range of motion and neck supple.  Skin:    General: Skin is warm and dry.  Neurological:     Mental Status: He is alert and oriented to person, place, and time.     ED Results / Procedures / Treatments   Labs (all labs ordered are listed, but only abnormal results are displayed) Labs Reviewed  URINALYSIS, ROUTINE W REFLEX MICROSCOPIC - Abnormal; Notable for the following components:      Result Value   Color, Urine AMBER (*)    APPearance HAZY (*)    Protein, ur 30 (*)    Leukocytes,Ua TRACE (*)    Bacteria, UA RARE (*)    All other components within normal limits  CBC - Abnormal; Notable for the following components:   Hemoglobin 12.9 (*)    HCT 37.7 (*)    Platelets 148 (*)    All other components within normal limits  COMPREHENSIVE METABOLIC PANEL - Abnormal; Notable for the following components:   Sodium 134 (*)    Chloride 94 (*)    Glucose, Bld 118 (*)    BUN 34 (*)    Creatinine, Ser 2.13 (*)    Total Bilirubin 1.9 (*)    GFR, Estimated 32 (*)    All other components within normal limits  URINE CULTURE  SARS CORONAVIRUS 2 (TAT 6-24 HRS)  LIPASE, BLOOD    EKG None  Radiology CT ABDOMEN PELVIS WO CONTRAST  Result Date: 12/09/2020 CLINICAL DATA:  Acute right flank pain. EXAM: CT ABDOMEN AND PELVIS WITHOUT CONTRAST TECHNIQUE: Multidetector CT imaging of the abdomen and pelvis was performed following the standard protocol without IV contrast. COMPARISON:  May 25, 2006. FINDINGS: Lower chest: No acute abnormality. Hepatobiliary: No focal liver abnormality is seen.  Status post cholecystectomy. No biliary dilatation. Pancreas: Unremarkable. No pancreatic ductal dilatation or surrounding inflammatory changes. Spleen: Normal in size without focal abnormality. Adrenals/Urinary Tract: Adrenal glands are unremarkable. Kidneys are normal, without renal calculi, focal lesion, or hydronephrosis. Bladder is unremarkable. Stomach/Bowel: Stomach is within normal limits. Appendix appears normal. There is no evidence of bowel obstruction. However, mesenteric inflammatory changes are noted in the right lower quadrant with mildly enlarged lymph nodes present. There does appear to be mild  dilatation of adjacent small bowel loops, and some form of ileal or jejunal diverticulitis may be present. Meckel's diverticulum may be a possibility. Vascular/Lymphatic: Atherosclerosis of abdominal aorta is noted without aneurysm formation. Reproductive: Prostate is unremarkable. Other: No abdominal wall hernia or abnormality. No abdominopelvic ascites. Musculoskeletal: No acute or significant osseous findings. IMPRESSION: 1. Mesenteric inflammatory changes are noted in the right lower quadrant with mildly enlarged lymph nodes present. There does appear to be mild dilatation of adjacent small bowel loops, and some form of ileal or jejunal diverticulitis may be present. Meckel's diverticulum may be a possibility. 2. Aortic atherosclerosis. Aortic Atherosclerosis (ICD10-I70.0). Electronically Signed   By: Lupita Raider M.D.   On: 12/09/2020 19:38    Procedures Procedures (including critical care time)  Medications Ordered in ED Medications  piperacillin-tazobactam (ZOSYN) IVPB 3.375 g (has no administration in time range)  sodium chloride 0.9 % bolus 1,000 mL (1,000 mLs Intravenous New Bag/Given 12/09/20 1951)    ED Course  I have reviewed the triage vital signs and the nursing notes.  Pertinent labs & imaging results that were available during my care of the patient were reviewed by me and  considered in my medical decision making (see chart for details).  Clinical Course as of 12/09/20 2109  Wed Dec 09, 2020  1825 Leukocytes,Ua(!): TRACE [JS]  1825 Bacteria, UA(!): RARE [JS]  1906 Creatinine(!): 2.13 [JS]    Clinical Course User Index [JS] Claude Manges, PA-C   MDM Rules/Calculators/A&P   Patient with a past medical history of hypertension presents to the ED with a chief complaint of abdominal pain and hypotension.  Primary history obtained from wife as patient has a history of Alzheimer's.  He was seen at urgent care earlier, recommended to be seen in the ED due to hypotension with pressures with a systolic in the 100s.  Cording to wife patient has been complaining of lower abdominal pain for the past 2 days, has had a lack of appetite not drinking and eating.  He has not had any episodes of vomiting however multiple episodes of diarrhea, none of these with blood.  No recent antibiotic use, no recent change in medications, no sick contacts.  His surgical history of the abdomen remarkable for cholecystectomy.  During arrival to the ED patient's vitals are within normal limits, blood pressure is normotensive and he is afebrile satting at 98% on room air.  No recent URI symptoms per wife.  According to wife at the bedside, patient had a colonoscopy 3 years ago which did not show any signs of diverticulosis or polyps.  Physical examination revealed tenderness along the right lower quadrant and suprapubic region along McBurney's sign.  CVA tender noticed bilaterally.  Bowel sounds are slightly diminished.  Lungs are clear to auscultation, no tenderness with chest palpation.  Differential diagnoses included but not limited to ileus, appendicitis, gastroenteritis, diverticulitis.  Interpretation of his labs revealed a CBC without any leukocytosis, slight decrease in hemoglobin but stable.  CMP remarkable for mild hyponatremia, BUN is elevated, creatinine level significant for an AKI, no  prior labs for comparison, patient without any history of kidney disease.  LFTs are within normal limits.  UA remarkable for trace of leukocytes, rare bacteria, white count 6-10, will send this for culture at this time.   CT Abdomen showed: 1. Mesenteric inflammatory changes are noted in the right lower  quadrant with mildly enlarged lymph nodes present. There does appear  to be mild dilatation of adjacent small bowel loops,  and some form  of ileal or jejunal diverticulitis may be present. Meckel's  diverticulum may be a possibility.  2. Aortic atherosclerosis.     8:22 PM Spoke to Dr. Fredricka Bonine of general surgery who recommended fluids, antibiotics will evaluate patient while in the ED. Patient will need admission via hospitalist service at this time. I have discussed case with my attending Dr. Lockie Mola who has informed family of results and agrees with plan and management at this time.   9:06 PM Spoke to Dr. Loney Loh who will admit patient for further management. Of note patient is vaccinated and boosted per wife at the bedside.    Portions of this note were generated with Scientist, clinical (histocompatibility and immunogenetics). Dictation errors may occur despite best attempts at proofreading.  Final Clinical Impression(s) / ED Diagnoses Final diagnoses:  Right lower quadrant pain    Rx / DC Orders ED Discharge Orders    None       Claude Manges, Cordelia Poche 12/09/20 2109    Virgina Norfolk, DO 12/09/20 2326

## 2020-12-09 NOTE — Consult Note (Signed)
Surgical Evaluation Requesting provider: Claude Manges PA-C  Chief Complaint: abdominal pain  HPI: 74 year old man with history of hypertension, GERD who presents to the ER with a 3-day history of abdominal pain.  History is provided by the patient's wife and chart review as the patient has significant Alzheimer's and is unable to give a history.  She states that he does have intermittent issues with vague abdominal pain for many years but usually she can give him a Tylenol and it will resolve.  Beginning on Monday, she noted that he was complaining of abdominal pain and she also noted that his appetite was decreased compared to his normal.  She tried to give him Tylenol and it seemed a little bit better but not completely resolved.  He did have a normal loose bowel movement on Tuesday which was nonbloody.  Continued to have some decreased appetite, and continued to complain of pain and so this morning he was brought to an urgent care where the examiner noted tenderness in the right lower quadrant as well as relative hypotension with systolic blood pressure in the 100s and referred him to the [facility] ER for further work-up.  He has not had any fevers, no emesis or complaint of nausea.  Reportedly he had a colonoscopy 3 years ago which was negative for diverticulosis or polyps. On arrival to the [facility] ER, the patient's vital signs were normal.  Labs and imaging as below.   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   No Known Allergies  Past Medical History:  Diagnosis Date  . GERD (gastroesophageal reflux disease)   . Hypertension   . Thyroid disease     Past Surgical History:  Procedure Laterality Date  . CHOLECYSTECTOMY  2010  . COLONOSCOPY  2008  . POLYPECTOMY  2008    Family History  Problem Relation Age of Onset  . Colon cancer Neg Hx   . Asthma Mother   . Heart attack Father   . Heart disease Paternal Uncle     Social History   Socioeconomic History   . Marital status: Married    Spouse name: Not on file  . Number of children: Not on file  . Years of education: Not on file  . Highest education level: Not on file  Occupational History  . Not on file  Tobacco Use  . Smoking status: Former Smoker    Packs/day: 1.00    Years: 50.00    Pack years: 50.00    Types: Cigarettes    Quit date: 01/06/2013    Years since quitting: 7.9  . Smokeless tobacco: Never Used  Substance and Sexual Activity  . Alcohol use: No    Alcohol/week: 0.0 standard drinks  . Drug use: No  . Sexual activity: Not on file  Other Topics Concern  . Not on file  Social History Narrative  . Not on file   Social Determinants of Health   Financial Resource Strain: Not on file  Food Insecurity: Not on file  Transportation Needs: Not on file  Physical Activity: Not on file  Stress: Not on file  Social Connections: Not on file    No current facility-administered medications on file prior to encounter.   Current Outpatient Medications on File Prior to Encounter  Medication Sig Dispense Refill  . hydrochlorothiazide (HYDRODIURIL) 25 MG tablet Take 25 mg by mouth daily.    Marland Kitchen levothyroxine (SYNTHROID, LEVOTHROID) 50 MCG tablet Take 50 mcg by mouth daily before  breakfast.    . losartan (COZAAR) 100 MG tablet Take 100 mg by mouth daily.    . Omega-3 Fatty Acids (FISH OIL) 1200 MG CAPS Take by mouth.    Marland Kitchen omeprazole (PRILOSEC) 20 MG capsule Take 20 mg by mouth daily.      Review of Systems: a complete, 10pt review of systems was unable to be completed due to patient mental status  Physical Exam: Vitals:   12/09/20 1540 12/09/20 1834  BP: 111/66 125/67  Pulse: 61 (!) 55  Resp: 17 16  Temp:  98.3 F (36.8 C)  SpO2: 99% 98%   Gen: Alert, confused, cooperative Eyes: lids and conjunctivae normal, no icterus. Pupils equally round and reactive to light.  Neck: supple without mass or thyromegaly Chest: respiratory effort is normal. No crepitus or tenderness on  palpation of the chest. Breath sounds equal.  Cardiovascular: RRR with palpable distal pulses, no pedal edema Gastrointestinal: soft, nondistended, focally tender in the right lower quadrant with voluntary guarding. Muscoloskeletal: no clubbing or cyanosis of the fingers.  Strength is symmetrical throughout.  Range of motion of bilateral upper and lower extremities normal without pain, crepitation or contracture. Neuro: Patient is confused, unable to appropriately answer questions or compose a coherent statement. Cranial nerves grossly intact.  Sensation intact to light touch diffusely. Skin: warm and dry, no rash or lesion on limited exam   CBC Latest Ref Rng & Units 12/09/2020  WBC 4.0 - 10.5 K/uL 9.8  Hemoglobin 13.0 - 17.0 g/dL 12.9(L)  Hematocrit 39.0 - 52.0 % 37.7(L)  Platelets 150 - 400 K/uL 148(L)    CMP Latest Ref Rng & Units 12/09/2020  Glucose 70 - 99 mg/dL 355(D)  BUN 8 - 23 mg/dL 32(K)  Creatinine 0.25 - 1.24 mg/dL 4.27(C)  Sodium 623 - 762 mmol/L 134(L)  Potassium 3.5 - 5.1 mmol/L 3.7  Chloride 98 - 111 mmol/L 94(L)  CO2 22 - 32 mmol/L 27  Calcium 8.9 - 10.3 mg/dL 9.5  Total Protein 6.5 - 8.1 g/dL 7.6  Total Bilirubin 0.3 - 1.2 mg/dL 8.3(T)  Alkaline Phos 38 - 126 U/L 38  AST 15 - 41 U/L 29  ALT 0 - 44 U/L 20    No results found for: INR, PROTIME  Imaging: CT ABDOMEN PELVIS WO CONTRAST  Result Date: 12/09/2020 CLINICAL DATA:  Acute right flank pain. EXAM: CT ABDOMEN AND PELVIS WITHOUT CONTRAST TECHNIQUE: Multidetector CT imaging of the abdomen and pelvis was performed following the standard protocol without IV contrast. COMPARISON:  May 25, 2006. FINDINGS: Lower chest: No acute abnormality. Hepatobiliary: No focal liver abnormality is seen. Status post cholecystectomy. No biliary dilatation. Pancreas: Unremarkable. No pancreatic ductal dilatation or surrounding inflammatory changes. Spleen: Normal in size without focal abnormality. Adrenals/Urinary Tract: Adrenal  glands are unremarkable. Kidneys are normal, without renal calculi, focal lesion, or hydronephrosis. Bladder is unremarkable. Stomach/Bowel: Stomach is within normal limits. Appendix appears normal. There is no evidence of bowel obstruction. However, mesenteric inflammatory changes are noted in the right lower quadrant with mildly enlarged lymph nodes present. There does appear to be mild dilatation of adjacent small bowel loops, and some form of ileal or jejunal diverticulitis may be present. Meckel's diverticulum may be a possibility. Vascular/Lymphatic: Atherosclerosis of abdominal aorta is noted without aneurysm formation. Reproductive: Prostate is unremarkable. Other: No abdominal wall hernia or abnormality. No abdominopelvic ascites. Musculoskeletal: No acute or significant osseous findings. IMPRESSION: 1. Mesenteric inflammatory changes are noted in the right lower quadrant with mildly enlarged lymph  nodes present. There does appear to be mild dilatation of adjacent small bowel loops, and some form of ileal or jejunal diverticulitis may be present. Meckel's diverticulum may be a possibility. 2. Aortic atherosclerosis. Aortic Atherosclerosis (ICD10-I70.0). Electronically Signed   By: Lupita Raider M.D.   On: 12/09/2020 19:38     A/P: 74 year old man with 3-day history of abdominal pain and decreased appetite.  I have reviewed his CT images personally, mesenteric inflammatory changes notable in the right lower quadrant with mild mesenteric lymphadenopathy, small bowel diverticulitis or Meckel's "may be a possibility". I would add mesenteric adenitis to the differential. He also has AKI. Recommend admission for fluid resuscitation, serial exams/labs, empiric antibiotics. Surgery will follow closely. If pain worsens or develops fever/leukocytosis, will consider diagnostic laparoscopy. His wife is his HCPOA.        Phylliss Blakes, MD [facility], [place]  See AMION to contact appropriate  on-call provider

## 2020-12-10 ENCOUNTER — Encounter (HOSPITAL_COMMUNITY): Payer: Self-pay | Admitting: Internal Medicine

## 2020-12-10 ENCOUNTER — Inpatient Hospital Stay (HOSPITAL_COMMUNITY): Payer: Medicare Other

## 2020-12-10 DIAGNOSIS — N179 Acute kidney failure, unspecified: Secondary | ICD-10-CM | POA: Diagnosis not present

## 2020-12-10 DIAGNOSIS — R1031 Right lower quadrant pain: Secondary | ICD-10-CM | POA: Diagnosis not present

## 2020-12-10 DIAGNOSIS — F0390 Unspecified dementia without behavioral disturbance: Secondary | ICD-10-CM

## 2020-12-10 DIAGNOSIS — R109 Unspecified abdominal pain: Secondary | ICD-10-CM

## 2020-12-10 DIAGNOSIS — R001 Bradycardia, unspecified: Secondary | ICD-10-CM

## 2020-12-10 HISTORY — DX: Unspecified abdominal pain: R10.9

## 2020-12-10 LAB — BASIC METABOLIC PANEL
Anion gap: 12 (ref 5–15)
BUN: 31 mg/dL — ABNORMAL HIGH (ref 8–23)
CO2: 28 mmol/L (ref 22–32)
Calcium: 9.2 mg/dL (ref 8.9–10.3)
Chloride: 96 mmol/L — ABNORMAL LOW (ref 98–111)
Creatinine, Ser: 1.72 mg/dL — ABNORMAL HIGH (ref 0.61–1.24)
GFR, Estimated: 42 mL/min — ABNORMAL LOW (ref >60)
Glucose, Bld: 100 mg/dL — ABNORMAL HIGH (ref 70–99)
Potassium: 2.9 mmol/L — ABNORMAL LOW (ref 3.5–5.1)
Sodium: 136 mmol/L (ref 135–145)

## 2020-12-10 LAB — LACTIC ACID, PLASMA: Lactic Acid, Venous: 1.3 mmol/L (ref 0.5–1.9)

## 2020-12-10 LAB — HEPATIC FUNCTION PANEL
ALT: 39 U/L (ref 0–44)
AST: 59 U/L — ABNORMAL HIGH (ref 15–41)
Albumin: 3.6 g/dL (ref 3.5–5.0)
Alkaline Phosphatase: 42 U/L (ref 38–126)
Bilirubin, Direct: 0.5 mg/dL — ABNORMAL HIGH (ref 0.0–0.2)
Indirect Bilirubin: 1.4 mg/dL — ABNORMAL HIGH (ref 0.3–0.9)
Total Bilirubin: 1.9 mg/dL — ABNORMAL HIGH (ref 0.3–1.2)
Total Protein: 7.5 g/dL (ref 6.5–8.1)

## 2020-12-10 LAB — CBC
HCT: 38.8 % — ABNORMAL LOW (ref 39.0–52.0)
Hemoglobin: 12.8 g/dL — ABNORMAL LOW (ref 13.0–17.0)
MCH: 29.6 pg (ref 26.0–34.0)
MCHC: 33 g/dL (ref 30.0–36.0)
MCV: 89.6 fL (ref 80.0–100.0)
Platelets: 154 10*3/uL (ref 150–400)
RBC: 4.33 MIL/uL (ref 4.22–5.81)
RDW: 11.7 % (ref 11.5–15.5)
WBC: 9.4 10*3/uL (ref 4.0–10.5)
nRBC: 0 % (ref 0.0–0.2)

## 2020-12-10 LAB — URINE CULTURE: Culture: <10000 — AB

## 2020-12-10 LAB — TSH: TSH: 2.197 u[IU]/mL (ref 0.350–4.500)

## 2020-12-10 LAB — SARS CORONAVIRUS 2 (TAT 6-24 HRS): SARS Coronavirus 2: NEGATIVE

## 2020-12-10 MED ORDER — LORAZEPAM 2 MG/ML IJ SOLN
1.0000 mg | Freq: Four times a day (QID) | INTRAMUSCULAR | Status: DC
  Administered 2020-12-10 – 2020-12-11 (×3): 1 mg via INTRAVENOUS
  Filled 2020-12-10 (×4): qty 1

## 2020-12-10 NOTE — ED Notes (Signed)
Pt sitting calmly at nurses station.

## 2020-12-10 NOTE — ED Notes (Signed)
Patient to be transported upstairs with all belongings.   Wife at bedside.

## 2020-12-10 NOTE — ED Notes (Signed)
Pt keeps disconnecting self from monitor and Iv fluid. Pt will not stay in bed and is rambling. Sitter ordered.

## 2020-12-10 NOTE — ED Notes (Signed)
Pt not tolerating VS to be taken at this time. Pt will not allow for cardiac monitoring.

## 2020-12-10 NOTE — ED Notes (Signed)
Wife with patient.  No issues noted

## 2020-12-10 NOTE — Progress Notes (Addendum)
Patient has become increasingly confused, agitated and combative, patient is not easily orientated,refusing to sit down, ripping off tele. MD notified, tele-sitter ordered. 1MG  of ativan administered per order. Will continue to closely monitor.

## 2020-12-10 NOTE — Progress Notes (Signed)
Subjective: Patient is incredibly demented.  He can not give [place] any information and will not appropriately answer any of our questions.  ROS: unable due to severe dementia  Objective: Vital signs in last 24 hours: Temp:  [98.3 F (36.8 C)-98.6 F (37 C)] 98.5 F (36.9 C) (12/30 0757) Pulse Rate:  [52-61] 52 (12/30 0757) Resp:  [14-18] 16 (12/30 0757) BP: (100-129)/(47-67) 129/54 (12/30 0757) SpO2:  [97 %-100 %] 100 % (12/30 0757)    Intake/Output from previous day: 12/29 0701 - 12/30 0700 In: 1031.2 [IV Piggyback:1031.2] Out: -  Intake/Output this shift: No intake/output data recorded.  PE: GEN: demented but pleasant Abd: soft, mild tenderness in RLQ, +BS, ND  Lab Results:  Recent Labs    12/09/20 1420 12/10/20 0420  WBC 9.8 9.4  HGB 12.9* 12.8*  HCT 37.7* 38.8*  PLT 148* 154   BMET Recent Labs    12/09/20 1420 12/10/20 0420  NA 134* 136  K 3.7 2.9*  CL 94* 96*  CO2 27 28  GLUCOSE 118* 100*  BUN 34* 31*  CREATININE 2.13* 1.72*  CALCIUM 9.5 9.2   PT/INR No results for input(s): LABPROT, INR in the last 72 hours. CMP     Component Value Date/Time   NA 136 12/10/2020 0420   K 2.9 (L) 12/10/2020 0420   CL 96 (L) 12/10/2020 0420   CO2 28 12/10/2020 0420   GLUCOSE 100 (H) 12/10/2020 0420   BUN 31 (H) 12/10/2020 0420   CREATININE 1.72 (H) 12/10/2020 0420   CALCIUM 9.2 12/10/2020 0420   PROT 7.5 12/10/2020 0420   ALBUMIN 3.6 12/10/2020 0420   AST 59 (H) 12/10/2020 0420   ALT 39 12/10/2020 0420   ALKPHOS 42 12/10/2020 0420   BILITOT 1.9 (H) 12/10/2020 0420   GFRNONAA 42 (L) 12/10/2020 0420   Lipase     Component Value Date/Time   LIPASE 19 12/09/2020 1420       Studies/Results: CT ABDOMEN PELVIS WO CONTRAST  Result Date: 12/09/2020 CLINICAL DATA:  Acute right flank pain. EXAM: CT ABDOMEN AND PELVIS WITHOUT CONTRAST TECHNIQUE: Multidetector CT imaging of the abdomen and pelvis was performed following the standard protocol without  IV contrast. COMPARISON:  May 25, 2006. FINDINGS: Lower chest: No acute abnormality. Hepatobiliary: No focal liver abnormality is seen. Status post cholecystectomy. No biliary dilatation. Pancreas: Unremarkable. No pancreatic ductal dilatation or surrounding inflammatory changes. Spleen: Normal in size without focal abnormality. Adrenals/Urinary Tract: Adrenal glands are unremarkable. Kidneys are normal, without renal calculi, focal lesion, or hydronephrosis. Bladder is unremarkable. Stomach/Bowel: Stomach is within normal limits. Appendix appears normal. There is no evidence of bowel obstruction. However, mesenteric inflammatory changes are noted in the right lower quadrant with mildly enlarged lymph nodes present. There does appear to be mild dilatation of adjacent small bowel loops, and some form of ileal or jejunal diverticulitis may be present. Meckel's diverticulum may be a possibility. Vascular/Lymphatic: Atherosclerosis of abdominal aorta is noted without aneurysm formation. Reproductive: Prostate is unremarkable. Other: No abdominal wall hernia or abnormality. No abdominopelvic ascites. Musculoskeletal: No acute or significant osseous findings. IMPRESSION: 1. Mesenteric inflammatory changes are noted in the right lower quadrant with mildly enlarged lymph nodes present. There does appear to be mild dilatation of adjacent small bowel loops, and some form of ileal or jejunal diverticulitis may be present. Meckel's diverticulum may be a possibility. 2. Aortic atherosclerosis. Aortic Atherosclerosis (ICD10-I70.0). Electronically Signed   By: Zenda Alpers.D.  On: 12/09/2020 19:38   US RENAL  Result Date: 12/10/2020 CLINICAL DATA:  Acute kidney injury EXAM: RENAL / URINARY TRACT ULTRASOUND COMPLETE COMPARISON:  None. FINDINGS: Right Kidney: Renal measurements: 10.7 x 5.2 x 4.0 cm = volume: 115 mL. Echogenicity within normal limits. No mass or hydronephrosis visualized. Left Kidney: Renal measurements:  10.5 x 5.2 x 4.8 cm = volume: 136 mL. Echogenicity within normal limits. Within the lower pole of the left kidney there is a small anechoic cyst measuring 1.3 x 1.3 cm. Bladder: Appears normal for degree of bladder distention. Other: None. IMPRESSION: Normal appearing right kidney. Left renal cyst. Electronically Signed   By: Jonna Clark M.D.   On: 12/10/2020 00:21    Anti-infectives: Anti-infectives (From admission, onward)   Start     Dose/Rate Route Frequency Ordered Stop   12/10/20 0600  piperacillin-tazobactam (ZOSYN) IVPB 3.375 g        3.375 g 12.5 mL/hr over 240 Minutes Intravenous Every 8 hours 12/09/20 2344     12/09/20 2030  piperacillin-tazobactam (ZOSYN) IVPB 3.375 g        3.375 g 100 mL/hr over 30 Minutes Intravenous  Once 12/09/20 2020 12/09/20 2258       Assessment/Plan Dementia AKI - improving Bradycardia Possible UTI HTN Hypokalemia - per medicine  RLQ abdominal pain, mesenteric inflammation -WBC is normal today -vitals stable -still has some RLQ abdominal but certainly no guarding or peritonitis -cont NPO -cont abx therapy -we will follow closely  FEN - NPO/IVFs VTE - heparin subq ID - zosyn   LOS: 1 day    Letha Cape , [facility] Surgery 12/10/2020, 9:52 AM Please see Amion for pager number during day hours 7:00am-4:30pm or 7:00am -11:30am on weekends

## 2020-12-10 NOTE — Progress Notes (Signed)
PROGRESS NOTE  Kevin Kemp YQI:[id number] DOB: 10-23-1946 DOA: 12/09/2020 PCP: April Manson, NP  HPI/Recap of past 48 hours: 74 year old male with past medical history of hypertension, hypothyroidism, Alzheimer's dementia brought in to the emergency room on 12/29 with complaints of abdominal pain and hypotension.  According to wife who was present, patient had been having lower abdominal pain for the past few days and eating/drinking very little.  No episodes of vomiting reported, but did have multiple episodes of diarrhea.  Vaccinated against Covid.  Emergency room, noted to have systolic in the 100s, normal white count, bradycardic and acute kidney injury with BUN 34, creatinine 2.1.  CT of the abdomen pelvis noted mesenteric inflammatory changes in the right lower quadrant with mildly enlarged lymph nodes and mild dilatation of adjacent small bowel loops and some form of ileal or jejunal diverticulitis may be present with possibility of Meckel's diverticulum.  Seen by general surgery and with normal white count, fluids, antibiotics and serial abdominal exams to follow.  Lactic acid was unremarkable.  Overnight no issues.  Lab work this morning notes improvement in patient's creatinine down to 1.72 with white blood cell count still staying normal.  Patient himself does not report any abdominal pain although with his dementia it is very hard to get him to answer questions appropriately.  Discussed with surgery and trying trial of clear liquids.  Assessment/Plan: Principal Problem:   Abdominal pain with possibility of gastroenteritis versus mesenteritis versus small intestine diverticulitis: Being followed by general surgery.  Continue antibiotics.  We will try clear liquids.  White count staying normal so far.  Repeat labs in the morning and if he is still tolerating p.o. and white count normal, could potentially discharge. Active Problems:   AKI (acute kidney injury) [facility]): Improving with IV  fluids, secondary to poor p.o. intake and diarrhea.    Bradycardia: TSH normal.  Not on any beta-blocker.  Continue to monitor.  Asymptomatic.   HTN (hypertension): Blood pressure initially slightly soft, improved with fluids.    Hypothyroidism: Stable, TSH normal.  Continue Synthroid.    Senile dementia uncomp, without behavioral disturbance (HCC): Stable although dementia is advanced and he is quite mobile.  Fortunately, wife able to stay with him.   Code Status: Full code  Family Communication: Wife at bedside  Disposition Plan: Potential discharge tomorrow if white count normal, exam remains benign and tolerating liquids.   Consultants:  General surgery  Procedures:  None  Antimicrobials:  IV Zosyn 12/29-present  DVT prophylaxis: Subcu heparin   Objective: Vitals:   12/10/20 1134 12/10/20 1443  BP: (!) 108/54 130/69  Pulse: (!) 51 (!) 46  Resp: 20 18  Temp:  98 F (36.7 C)  SpO2: 99% 100%    Intake/Output Summary (Last 24 hours) at 12/10/2020 1551 Last data filed at 12/10/2020 1304 Gross per 24 hour  Intake 2224.95 ml  Output --  Net 2224.95 ml   Filed Weights   12/10/20 1443  Weight: 74.8 kg   Body mass index is 22.37 kg/m.  Exam:   General: Awake, oriented x1  HEENT: Normocephalic and atraumatic mucous membranes slightly dry  Cardiovascular: Regular rate and rhythm, S1-S2  Respiratory: Clear to auscultation bilaterally  Abdomen: Soft, appears to be nontender, nondistended, hypoactive bowel sounds  Musculoskeletal: No clubbing or cyanosis or edema  Skin: No skin breaks, tears or lesions  Psychiatry: Advanced dementia   Data Reviewed: CBC: Recent Labs  Lab 12/09/20 1420 12/10/20 0420  WBC 9.8 9.4  HGB 12.9* 12.8*  HCT 37.7* 38.8*  MCV 89.1 89.6  PLT 148* 154   Basic Metabolic Panel: Recent Labs  Lab 12/09/20 1420 12/10/20 0420  NA 134* 136  K 3.7 2.9*  CL 94* 96*  CO2 27 28  GLUCOSE 118* 100*  BUN 34* 31*   CREATININE 2.13* 1.72*  CALCIUM 9.5 9.2   GFR: CrCl cannot be calculated (Unknown ideal weight.). Liver Function Tests: Recent Labs  Lab 12/09/20 1420 12/10/20 0420  AST 29 59*  ALT 20 39  ALKPHOS 38 42  BILITOT 1.9* 1.9*  PROT 7.6 7.5  ALBUMIN 3.8 3.6   Recent Labs  Lab 12/09/20 1420  LIPASE 19   No results for input(s): AMMONIA in the last 168 hours. Coagulation Profile: No results for input(s): INR, PROTIME in the last 168 hours. Cardiac Enzymes: No results for input(s): CKTOTAL, CKMB, CKMBINDEX, TROPONINI in the last 168 hours. BNP (last 3 results) No results for input(s): PROBNP in the last 8760 hours. HbA1C: No results for input(s): HGBA1C in the last 72 hours. CBG: No results for input(s): GLUCAP in the last 168 hours. Lipid Profile: No results for input(s): CHOL, HDL, LDLCALC, TRIG, CHOLHDL, LDLDIRECT in the last 72 hours. Thyroid Function Tests: Recent Labs    12/10/20 0029  TSH 2.197   Anemia Panel: No results for input(s): VITAMINB12, FOLATE, FERRITIN, TIBC, IRON, RETICCTPCT in the last 72 hours. Urine analysis:    Component Value Date/Time   COLORURINE AMBER (A) 12/09/2020 1420   APPEARANCEUR HAZY (A) 12/09/2020 1420   LABSPEC 1.023 12/09/2020 1420   PHURINE 5.0 12/09/2020 1420   GLUCOSEU NEGATIVE 12/09/2020 1420   HGBUR NEGATIVE 12/09/2020 1420   BILIRUBINUR NEGATIVE 12/09/2020 1420   KETONESUR NEGATIVE 12/09/2020 1420   PROTEINUR 30 (A) 12/09/2020 1420   NITRITE NEGATIVE 12/09/2020 1420   LEUKOCYTESUR TRACE (A) 12/09/2020 1420   Sepsis Labs: @LABRCNTIP (procalcitonin:4,lacticidven:4)  ) Recent Results (from the past 240 hour(s))  SARS CORONAVIRUS 2 (TAT 6-24 HRS) Nasopharyngeal Nasopharyngeal Swab     Status: None   Collection Time: 12/09/20  9:55 PM   Specimen: Nasopharyngeal Swab  Result Value Ref Range Status   SARS Coronavirus 2 NEGATIVE NEGATIVE Final    Comment: (NOTE) SARS-CoV-2 target nucleic acids are NOT DETECTED.  The  SARS-CoV-2 RNA is generally detectable in upper and lower respiratory specimens during the acute phase of infection. Negative results do not preclude SARS-CoV-2 infection, do not rule out co-infections with other pathogens, and should not be used as the sole basis for treatment or other patient management decisions. Negative results must be combined with clinical observations, patient history, and epidemiological information. The expected result is Negative.  Fact Sheet for Patients: 12/11/20  Fact Sheet for Healthcare Providers: HairSlick.no  This test is not yet approved or cleared by the quierodirigir.com FDA and  has been authorized for detection and/or diagnosis of SARS-CoV-2 by FDA under an Emergency Use Authorization (EUA). This EUA will remain  in effect (meaning this test can be used) for the duration of the COVID-19 declaration under Se ction 564(b)(1) of the Act, 21 U.S.C. section 360bbb-3(b)(1), unless the authorization is terminated or revoked sooner.  Performed at [facility], [address] [place], [place]       Studies: CT ABDOMEN PELVIS WO CONTRAST  Result Date: 12/09/2020 CLINICAL DATA:  Acute right flank pain. EXAM: CT ABDOMEN AND PELVIS WITHOUT CONTRAST TECHNIQUE: Multidetector CT imaging of the abdomen and pelvis was performed following the  standard protocol without IV contrast. COMPARISON:  May 25, 2006. FINDINGS: Lower chest: No acute abnormality. Hepatobiliary: No focal liver abnormality is seen. Status post cholecystectomy. No biliary dilatation. Pancreas: Unremarkable. No pancreatic ductal dilatation or surrounding inflammatory changes. Spleen: Normal in size without focal abnormality. Adrenals/Urinary Tract: Adrenal glands are unremarkable. Kidneys are normal, without renal calculi, focal lesion, or hydronephrosis. Bladder is unremarkable. Stomach/Bowel: Stomach is within normal  limits. Appendix appears normal. There is no evidence of bowel obstruction. However, mesenteric inflammatory changes are noted in the right lower quadrant with mildly enlarged lymph nodes present. There does appear to be mild dilatation of adjacent small bowel loops, and some form of ileal or jejunal diverticulitis may be present. Meckel's diverticulum may be a possibility. Vascular/Lymphatic: Atherosclerosis of abdominal aorta is noted without aneurysm formation. Reproductive: Prostate is unremarkable. Other: No abdominal wall hernia or abnormality. No abdominopelvic ascites. Musculoskeletal: No acute or significant osseous findings. IMPRESSION: 1. Mesenteric inflammatory changes are noted in the right lower quadrant with mildly enlarged lymph nodes present. There does appear to be mild dilatation of adjacent small bowel loops, and some form of ileal or jejunal diverticulitis may be present. Meckel's diverticulum may be a possibility. 2. Aortic atherosclerosis. Aortic Atherosclerosis (ICD10-I70.0). Electronically Signed   By: Lupita Raider M.D.   On: 12/09/2020 19:38   US RENAL  Result Date: 12/10/2020 CLINICAL DATA:  Acute kidney injury EXAM: RENAL / URINARY TRACT ULTRASOUND COMPLETE COMPARISON:  None. FINDINGS: Right Kidney: Renal measurements: 10.7 x 5.2 x 4.0 cm = volume: 115 mL. Echogenicity within normal limits. No mass or hydronephrosis visualized. Left Kidney: Renal measurements: 10.5 x 5.2 x 4.8 cm = volume: 136 mL. Echogenicity within normal limits. Within the lower pole of the left kidney there is a small anechoic cyst measuring 1.3 x 1.3 cm. Bladder: Appears normal for degree of bladder distention. Other: None. IMPRESSION: Normal appearing right kidney. Left renal cyst. Electronically Signed   By: Jonna Clark M.D.   On: 12/10/2020 00:21    Scheduled Meds: . heparin  5,000 Units Subcutaneous Q8H    Continuous Infusions: . piperacillin-tazobactam (ZOSYN)  IV Stopped (12/10/20 0559)      LOS: 1 day     Hollice Espy, MD [facility]   12/10/2020, 3:51 PM

## 2020-12-10 NOTE — ED Notes (Signed)
Pt got up out of bed again, pt is now seated in front of the nurses station

## 2020-12-10 NOTE — ED Notes (Signed)
Pt disrobed and attempting to leave room. Redirected back to bed.

## 2020-12-10 NOTE — Progress Notes (Signed)
NEW ADMISSION NOTE New Admission Note:   Arrival Method: wheelchair Mental Orientation:  Telemetry:5M04 Assessment: Completed Skin:intact IV:LFA Pain:none Tubes:none Safety Measures: Safety Fall Prevention Plan has been given, discussed and signed Admission: Completed [facility] Orientation: Patient has been orientated to the room, unit and staff.  Family:wife at bedside  Orders have been reviewed and implemented. Will continue to monitor the patient. Call light has been placed within reach and bed alarm has been activated.   Katelan Hirt S Kristopher Attwood, RN

## 2020-12-11 DIAGNOSIS — N179 Acute kidney failure, unspecified: Secondary | ICD-10-CM | POA: Diagnosis not present

## 2020-12-11 DIAGNOSIS — K529 Noninfective gastroenteritis and colitis, unspecified: Principal | ICD-10-CM

## 2020-12-11 DIAGNOSIS — I10 Essential (primary) hypertension: Secondary | ICD-10-CM | POA: Diagnosis not present

## 2020-12-11 DIAGNOSIS — F0391 Unspecified dementia with behavioral disturbance: Secondary | ICD-10-CM | POA: Diagnosis not present

## 2020-12-11 LAB — BASIC METABOLIC PANEL
Anion gap: 12 (ref 5–15)
BUN: 21 mg/dL (ref 8–23)
CO2: 27 mmol/L (ref 22–32)
Calcium: 8.9 mg/dL (ref 8.9–10.3)
Chloride: 98 mmol/L (ref 98–111)
Creatinine, Ser: 1.39 mg/dL — ABNORMAL HIGH (ref 0.61–1.24)
GFR, Estimated: 54 mL/min — ABNORMAL LOW (ref >60)
Glucose, Bld: 81 mg/dL (ref 70–99)
Potassium: 3.1 mmol/L — ABNORMAL LOW (ref 3.5–5.1)
Sodium: 137 mmol/L (ref 135–145)

## 2020-12-11 LAB — CBC
HCT: 32.4 % — ABNORMAL LOW (ref 39.0–52.0)
Hemoglobin: 11.4 g/dL — ABNORMAL LOW (ref 13.0–17.0)
MCH: 30.7 pg (ref 26.0–34.0)
MCHC: 35.2 g/dL (ref 30.0–36.0)
MCV: 87.3 fL (ref 80.0–100.0)
Platelets: 153 10*3/uL (ref 150–400)
RBC: 3.71 MIL/uL — ABNORMAL LOW (ref 4.22–5.81)
RDW: 11.7 % (ref 11.5–15.5)
WBC: 5.8 10*3/uL (ref 4.0–10.5)
nRBC: 0 % (ref 0.0–0.2)

## 2020-12-11 NOTE — Progress Notes (Signed)
Progress Note: General Surgery Service   Chief Complaint/Subjective: Mr. Kevin Kemp is demented and cannot provide a history  Objective: Vital signs in last 24 hours: Temp:  [98 F (36.7 C)-98.7 F (37.1 C)] 98.7 F (37.1 C) (12/31 0856) Pulse Rate:  [46-71] 71 (12/31 0856) Resp:  [16-18] 16 (12/31 0856) BP: (99-130)/(48-71) 121/71 (12/31 0856) SpO2:  [98 %-100 %] 98 % (12/31 0856) Weight:  [74.8 kg] 74.8 kg (12/30 1443) Last BM Date: 12/09/20  Intake/Output from previous day: 12/30 0701 - 12/31 0700 In: 1306.6 [I.V.:1193.8; IV Piggyback:112.9] Out: -  Intake/Output this shift: No intake/output data recorded.  Gen: Resting comfortably in bed  Resp: No increased work of breathing  Card: Rate and rhythm  Abd:, Nontender, nondistended  Lab Results: CBC  Recent Labs    12/10/20 0420 12/11/20 0335  WBC 9.4 5.8  HGB 12.8* 11.4*  HCT 38.8* 32.4*  PLT 154 153   BMET Recent Labs    12/10/20 0420 12/11/20 0335  NA 136 137  K 2.9* 3.1*  CL 96* 98  CO2 28 27  GLUCOSE 100* 81  BUN 31* 21  CREATININE 1.72* 1.39*  CALCIUM 9.2 8.9   PT/INR No results for input(s): LABPROT, INR in the last 72 hours. ABG No results for input(s): PHART, HCO3 in the last 72 hours.  Invalid input(s): PCO2, PO2  Anti-infectives: Anti-infectives (From admission, onward)   Start     Dose/Rate Route Frequency Ordered Stop   12/10/20 0600  piperacillin-tazobactam (ZOSYN) IVPB 3.375 g        3.375 g 12.5 mL/hr over 240 Minutes Intravenous Every 8 hours 12/09/20 2344     12/09/20 2030  piperacillin-tazobactam (ZOSYN) IVPB 3.375 g        3.375 g 100 mL/hr over 30 Minutes Intravenous  Once 12/09/20 2020 12/09/20 2258      Medications: Scheduled Meds: . heparin  5,000 Units Subcutaneous Q8H  . LORazepam  1 mg Intravenous Q6H   Continuous Infusions: . piperacillin-tazobactam (ZOSYN)  IV 3.375 g (12/11/20 0518)   PRN Meds:.acetaminophen **OR** acetaminophen  Assessment/Plan: Mr.  Kevin Kemp is demented and cannot provide a history, we were consulted due to abnormal findings on CT scan including some edema in the mesentery in the right lower quadrant.  After 24 hours of observation his white count remains normal and his belly remains nontender.  It would be reasonable to discharge patient today and follow-up with [place] as needed.   LOS: 2 days   Kevin Ore, MD [phone] [facility]

## 2020-12-11 NOTE — Discharge Instructions (Signed)

## 2020-12-11 NOTE — Discharge Summary (Signed)
Discharge Summary  Kevin Kemp JSH:[id number] DOB: 15-Nov-1946  PCP: April Manson, NP  Admit date: 12/09/2020 Discharge date: 12/11/2020  Time spent: 25 minutes  Recommendations for Outpatient Follow-up:  1. Patient will follow up with his PCP in approximately 2 to 3 weeks  Discharge Diagnoses:  Active Hospital Problems   Diagnosis Date Noted  . Abdominal pain 12/09/2020  . Senile dementia uncomp, without behavioral disturbance (HCC) 12/10/2020  . AKI (acute kidney injury) (HCC) 12/09/2020  . Bradycardia 12/09/2020  . HTN (hypertension) 12/09/2020  . Hypothyroidism 12/09/2020    Resolved Hospital Problems  No resolved problems to display.    Discharge Condition: Improved, being discharged home  Diet recommendation: Low-sodium  Vitals:   12/11/20 0446 12/11/20 0856  BP: (!) 99/48 121/71  Pulse: (!) 55 71  Resp: 16 16  Temp: 98.4 F (36.9 C) 98.7 F (37.1 C)  SpO2: 98% 98%    History of present illness:  74 year old male with past medical history of hypertension, hypothyroidism, Alzheimer's dementia brought in to the emergency room on 12/29 with complaints of abdominal pain and hypotension.  According to wife who was present, patient had been having lower abdominal pain for the past few days and eating/drinking very little.  No episodes of vomiting reported, but did have multiple episodes of diarrhea.  Vaccinated against Covid.  Emergency room, noted to have systolic in the 100s, normal white count, bradycardic and acute kidney injury with BUN 34, creatinine 2.1.  CT of the abdomen pelvis noted mesenteric inflammatory changes in the right lower quadrant with mildly enlarged lymph nodes and mild dilatation of adjacent small bowel loops and some form of ileal or jejunal diverticulitis may be present with possibility of Meckel's diverticulum.  Seen by general surgery and with normal white count, fluids, antibiotics and serial abdominal exams to follow.  Lactic acid was  unremarkable.  Hospital Course:    Abdominal pain with possibility of gastroenteritis versus mesenteritis versus small intestine diverticulitis: By following day, lab work noted improving creatinine and normal white blood cell count.  No fever.  Patient's dementia made it very difficult to assess if he was having any abdominal pain.  Diet was started initially on clear liquids which he tolerated.  By 12/31, tolerating full liquids without any incident.  Repeat labs noted continued normalization of white blood cell count.  Given his advanced dementia and agitation from being in the hospital, and his ability to tolerate food and normal white count, is felt that patient likely had an enteritis that has since passed.  Given no fever or white count, discharged home.  Does not need antibiotics. Active Problems:   AKI (acute kidney injury) [facility]): Improved well with IV fluids.  By day of discharge down to 1.39.  Should continue to improve with p.o. intake.    Bradycardia: TSH normal.  Not on any beta-blocker.  Continue to monitor.  Asymptomatic.   HTN (hypertension): Blood pressure initially slightly soft, improved with fluids.    Hypothyroidism: Stable, TSH normal.  Continue Synthroid.    Senile dementia uncomp, with behavioral disturbance (HCC): Initially was calm, but as patient stayed more in the hospital became much more agitated, especially at night.  Required Ativan to Keep him calm.  Him returning home will help tremendously   Procedures:  None  Consultations:  General surgery  Discharge Exam: BP 121/71 (BP Location: Right Arm)   Pulse 71   Temp 98.7 F (37.1 C) (Oral)   Resp 16   Wt  74.8 kg   SpO2 98%   BMI 22.37 kg/m   General: Underlying chronic dementia, currently calm Cardiovascular: Regular rate and rhythm, S1-S2 Respiratory: Clear to auscultation bilaterally  Discharge Instructions You were cared for by a hospitalist during your hospital stay. If you have any  questions about your discharge medications or the care you received while you were in the hospital after you are discharged, you can call the unit and asked to speak with the hospitalist on call if the hospitalist that took care of you is not available. Once you are discharged, your primary care physician will handle any further medical issues. Please note that NO REFILLS for any discharge medications will be authorized once you are discharged, as it is imperative that you return to your primary care physician (or establish a relationship with a primary care physician if you do not have one) for your aftercare needs so that they can reassess your need for medications and monitor your lab values.  Discharge Instructions    Diet - low sodium heart healthy   Complete by: As directed    Increase activity slowly   Complete by: As directed      Allergies as of 12/11/2020   No Known Allergies     Medication List    TAKE these medications   acetaminophen 500 MG tablet Commonly known as: TYLENOL Take 500 mg by mouth every 6 (six) hours as needed for moderate pain or fever.   hydrochlorothiazide 25 MG tablet Commonly known as: HYDRODIURIL Take 25 mg by mouth daily.   levocetirizine 5 MG tablet Commonly known as: XYZAL Take 5 mg by mouth at bedtime.   levothyroxine 50 MCG tablet Commonly known as: SYNTHROID Take 50 mcg by mouth as directed. Take 1 tablet 6 Days A Week (Mon thru Sat)   losartan 100 MG tablet Commonly known as: COZAAR Take 100 mg by mouth at bedtime.   pantoprazole 40 MG tablet Commonly known as: PROTONIX Take 40 mg by mouth daily.   rosuvastatin 5 MG tablet Commonly known as: CRESTOR Take 5 mg by mouth at bedtime.      No Known Allergies  Follow-up Information    Kathlen Brunswick L, NP Follow up in 3 week(s).   Specialty: Family Medicine Contact information: [phone] [address] [place] [address] [phone]                The results of  significant diagnostics from this hospitalization (including imaging, microbiology, ancillary and laboratory) are listed below for reference.    Significant Diagnostic Studies: CT ABDOMEN PELVIS WO CONTRAST  Result Date: 12/09/2020 CLINICAL DATA:  Acute right flank pain. EXAM: CT ABDOMEN AND PELVIS WITHOUT CONTRAST TECHNIQUE: Multidetector CT imaging of the abdomen and pelvis was performed following the standard protocol without IV contrast. COMPARISON:  May 25, 2006. FINDINGS: Lower chest: No acute abnormality. Hepatobiliary: No focal liver abnormality is seen. Status post cholecystectomy. No biliary dilatation. Pancreas: Unremarkable. No pancreatic ductal dilatation or surrounding inflammatory changes. Spleen: Normal in size without focal abnormality. Adrenals/Urinary Tract: Adrenal glands are unremarkable. Kidneys are normal, without renal calculi, focal lesion, or hydronephrosis. Bladder is unremarkable. Stomach/Bowel: Stomach is within normal limits. Appendix appears normal. There is no evidence of bowel obstruction. However, mesenteric inflammatory changes are noted in the right lower quadrant with mildly enlarged lymph nodes present. There does appear to be mild dilatation of adjacent small bowel loops, and some form of ileal or jejunal diverticulitis may be present. Meckel's  diverticulum may be a possibility. Vascular/Lymphatic: Atherosclerosis of abdominal aorta is noted without aneurysm formation. Reproductive: Prostate is unremarkable. Other: No abdominal wall hernia or abnormality. No abdominopelvic ascites. Musculoskeletal: No acute or significant osseous findings. IMPRESSION: 1. Mesenteric inflammatory changes are noted in the right lower quadrant with mildly enlarged lymph nodes present. There does appear to be mild dilatation of adjacent small bowel loops, and some form of ileal or jejunal diverticulitis may be present. Meckel's diverticulum may be a possibility. 2. Aortic atherosclerosis.  Aortic Atherosclerosis (ICD10-I70.0). Electronically Signed   By: Lupita Raider M.D.   On: 12/09/2020 19:38   US RENAL  Result Date: 12/10/2020 CLINICAL DATA:  Acute kidney injury EXAM: RENAL / URINARY TRACT ULTRASOUND COMPLETE COMPARISON:  None. FINDINGS: Right Kidney: Renal measurements: 10.7 x 5.2 x 4.0 cm = volume: 115 mL. Echogenicity within normal limits. No mass or hydronephrosis visualized. Left Kidney: Renal measurements: 10.5 x 5.2 x 4.8 cm = volume: 136 mL. Echogenicity within normal limits. Within the lower pole of the left kidney there is a small anechoic cyst measuring 1.3 x 1.3 cm. Bladder: Appears normal for degree of bladder distention. Other: None. IMPRESSION: Normal appearing right kidney. Left renal cyst. Electronically Signed   By: Jonna Clark M.D.   On: 12/10/2020 00:21    Microbiology: Recent Results (from the past 240 hour(s))  Urine culture     Status: Abnormal   Collection Time: 12/09/20  2:20 PM   Specimen: Urine, Random  Result Value Ref Range Status   Specimen Description URINE, RANDOM  Final   Special Requests NONE  Final   Culture (A)  Final    <10,000 COLONIES/mL INSIGNIFICANT GROWTH Performed at [facility], [address], [place], [place] [address]    Report Status 12/10/2020 FINAL  Final  SARS CORONAVIRUS 2 (TAT 6-24 HRS) Nasopharyngeal Nasopharyngeal Swab     Status: None   Collection Time: 12/09/20  9:55 PM   Specimen: Nasopharyngeal Swab  Result Value Ref Range Status   SARS Coronavirus 2 NEGATIVE NEGATIVE Final    Comment: (NOTE) SARS-CoV-2 target nucleic acids are NOT DETECTED.  The SARS-CoV-2 RNA is generally detectable in upper and lower respiratory specimens during the acute phase of infection. Negative results do not preclude SARS-CoV-2 infection, do not rule out co-infections with other pathogens, and should not be used as the sole basis for treatment or other patient management decisions. Negative results must be combined with  clinical observations, patient history, and epidemiological information. The expected result is Negative.  Fact Sheet for Patients: HairSlick.no  Fact Sheet for Healthcare Providers: quierodirigir.com  This test is not yet approved or cleared by the [place] FDA and  has been authorized for detection and/or diagnosis of SARS-CoV-2 by FDA under an Emergency Use Authorization (EUA). This EUA will remain  in effect (meaning this test can be used) for the duration of the COVID-19 declaration under Se ction 564(b)(1) of the Act, 21 U.S.C. section 360bbb-3(b)(1), unless the authorization is terminated or revoked sooner.  Performed at [facility], [address] [place], [place] [address]      Labs: Basic Metabolic Panel: Recent Labs  Lab 12/09/20 1420 12/10/20 0420 12/11/20 0335  NA 134* 136 137  K 3.7 2.9* 3.1*  CL 94* 96* 98  CO2 27 28 27   GLUCOSE 118* 100* 81  BUN 34* 31* 21  CREATININE 2.13* 1.72* 1.39*  CALCIUM 9.5 9.2 8.9   Liver Function Tests: Recent Labs  Lab 12/09/20 1420 12/10/20 0420  AST 29 59*  ALT 20 39  ALKPHOS 38 42  BILITOT 1.9* 1.9*  PROT 7.6 7.5  ALBUMIN 3.8 3.6   Recent Labs  Lab 12/09/20 1420  LIPASE 19   No results for input(s): AMMONIA in the last 168 hours. CBC: Recent Labs  Lab 12/09/20 1420 12/10/20 0420 12/11/20 0335  WBC 9.8 9.4 5.8  HGB 12.9* 12.8* 11.4*  HCT 37.7* 38.8* 32.4*  MCV 89.1 89.6 87.3  PLT 148* 154 153   Cardiac Enzymes: No results for input(s): CKTOTAL, CKMB, CKMBINDEX, TROPONINI in the last 168 hours. BNP: BNP (last 3 results) No results for input(s): BNP in the last 8760 hours.  ProBNP (last 3 results) No results for input(s): PROBNP in the last 8760 hours.  CBG: No results for input(s): GLUCAP in the last 168 hours.     Signed:  Hollice EspySendil K Davionna Blacksher, MD [facility] 12/11/2020, 12:47 PM

## 2020-12-11 NOTE — TOC Transition Note (Signed)
Transition of Care [facility]) - CM/SW Discharge Note   Patient Details  Name: Kevin Kemp MRN: [id number] Date of Birth: Jan 18, 1946  Transition of Care [facility]) CM/SW Contact:  Kermit Balo, RN Phone Number: 12/11/2020, 1:30 PM   Clinical Narrative:    Pt is discharging home with self care. No needs per TOC.   Final next level of care: Home/Self Care Barriers to Discharge: No Barriers Identified   Patient Goals and CMS Choice        Discharge Placement                       Discharge Plan and Services                                     Social Determinants of Health (SDOH) Interventions     Readmission Risk Interventions No flowsheet data found.

## 2021-12-04 IMAGING — CT CT ABD-PELV W/O CM
2 of 4 series · 16 of 46 positions shown, 18 images · non-contrast
Comparison: May 25, 2006.

CLINICAL DATA: Acute right flank pain.

EXAM:
CT ABDOMEN AND PELVIS WITHOUT CONTRAST
TECHNIQUE: Multidetector CT imaging of the abdomen and pelvis was performed
following the standard protocol without IV contrast.

[Series 3: abd/ pelvis 5.0 i30f 2 · axial · 0.79mm/px · z∈[+612,+1042]mm · 13 of 94 slices shown, 15 images]
[im 4/94  soft-tissue]
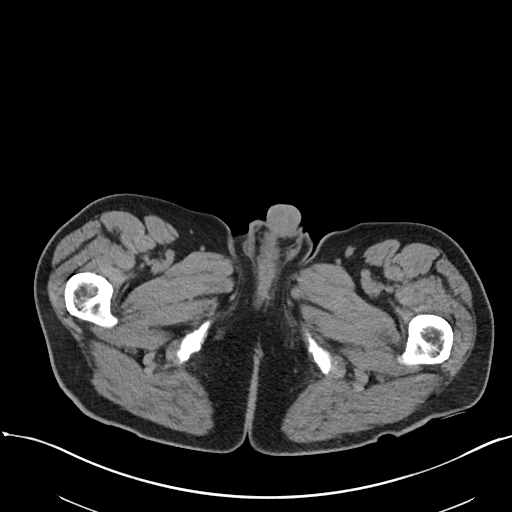
[im 4/94  bone]
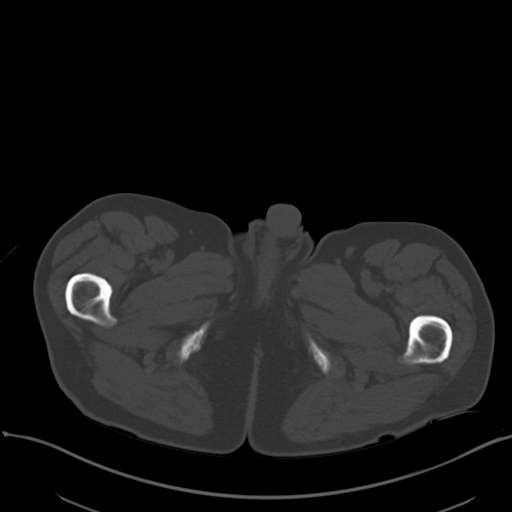
[im 12/94  soft-tissue]
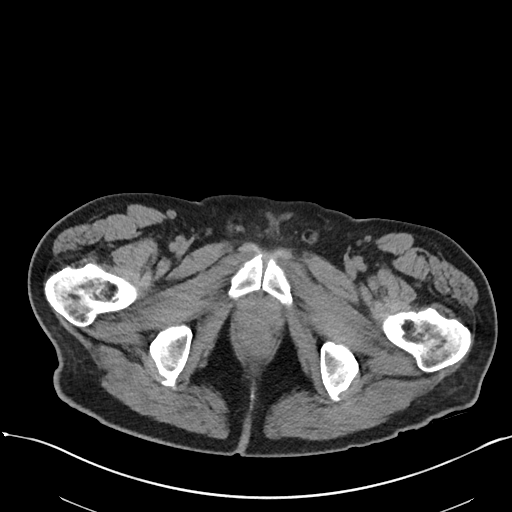
[im 20/94  soft-tissue]
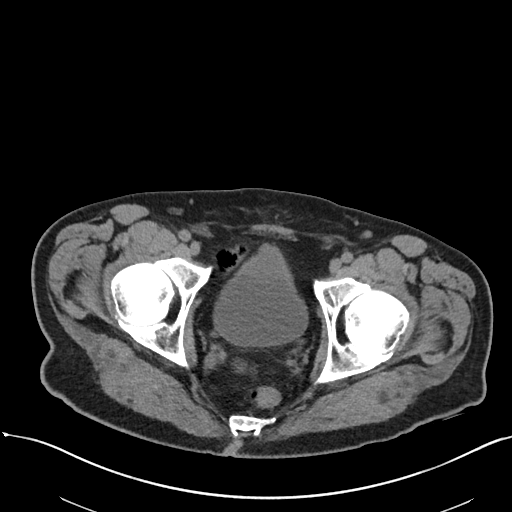
[im 28/94  soft-tissue]
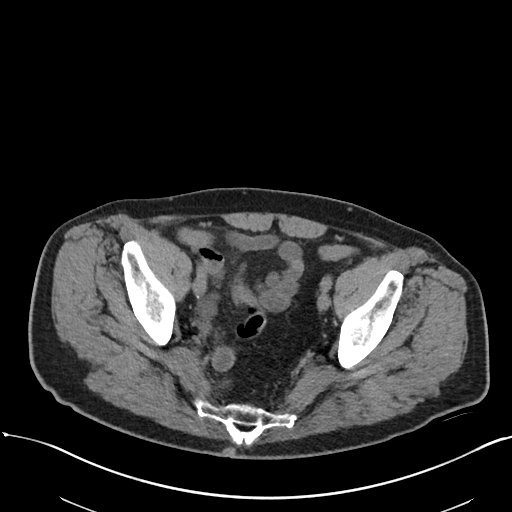
[im 32/94  soft-tissue]
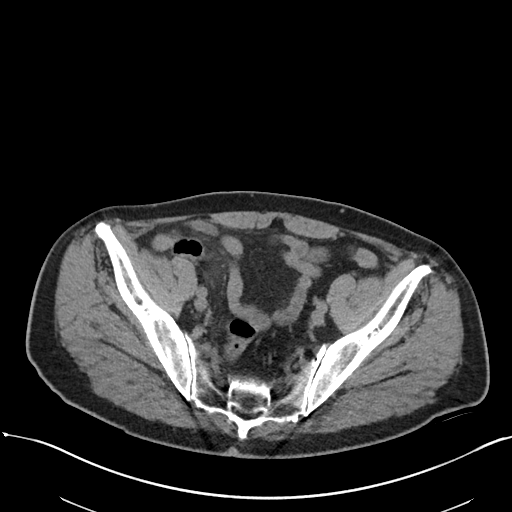
[im 39/94  soft-tissue]
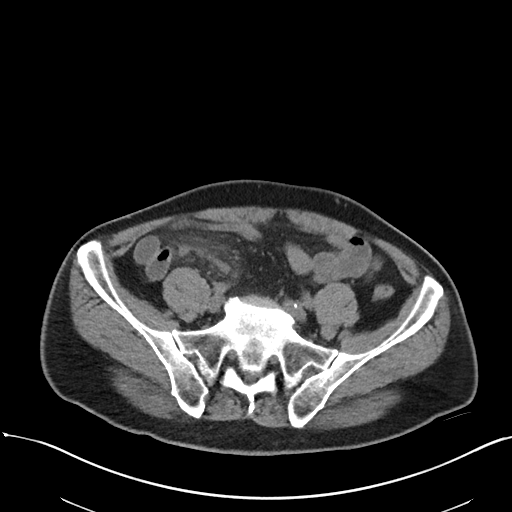
[im 47/94  soft-tissue]
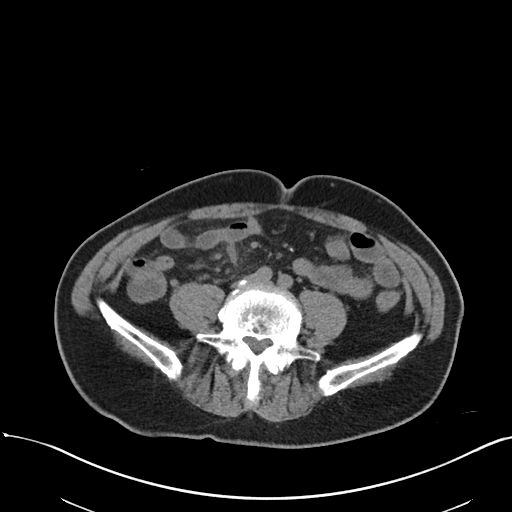
[im 55/94  soft-tissue]
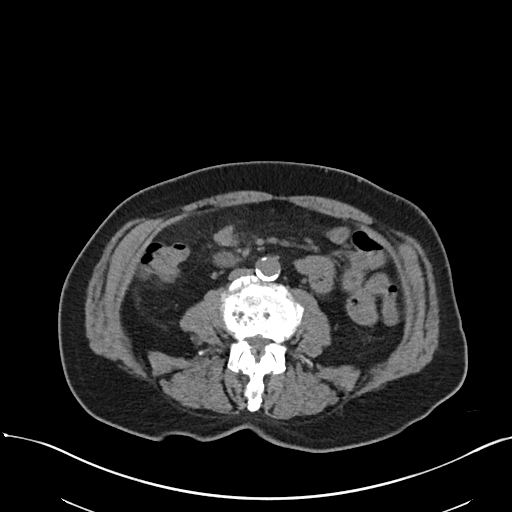
[im 63/94  soft-tissue]
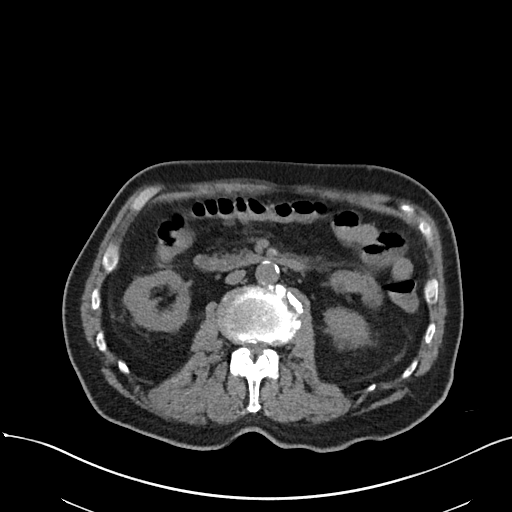
[im 63/94  bone]
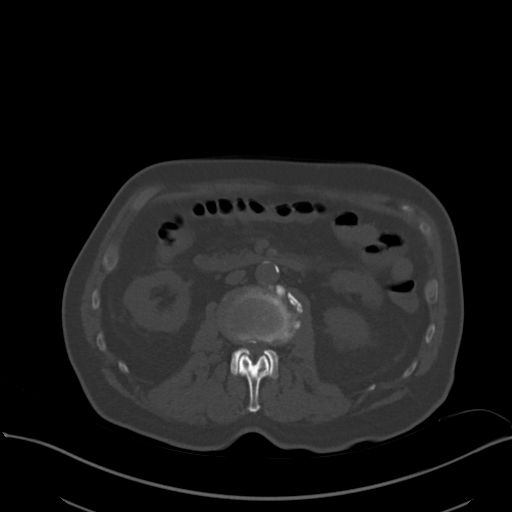
[im 66/94  soft-tissue]
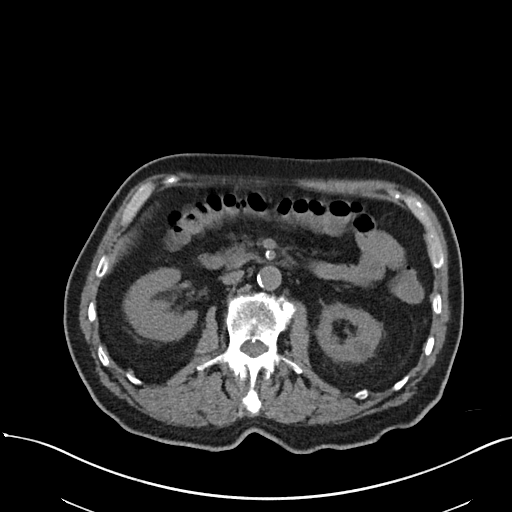
[im 74/94  soft-tissue]
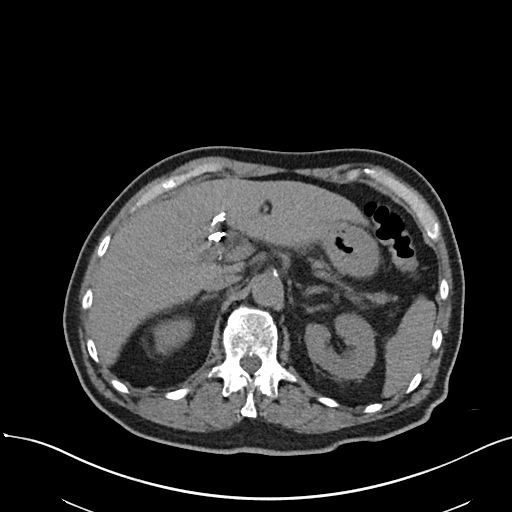
[im 82/94  soft-tissue]
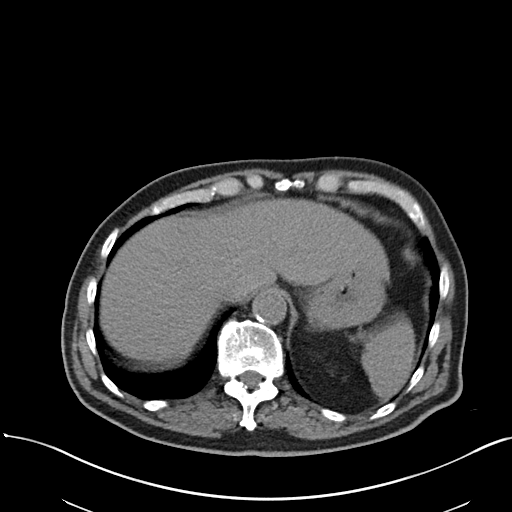
[im 90/94  soft-tissue]
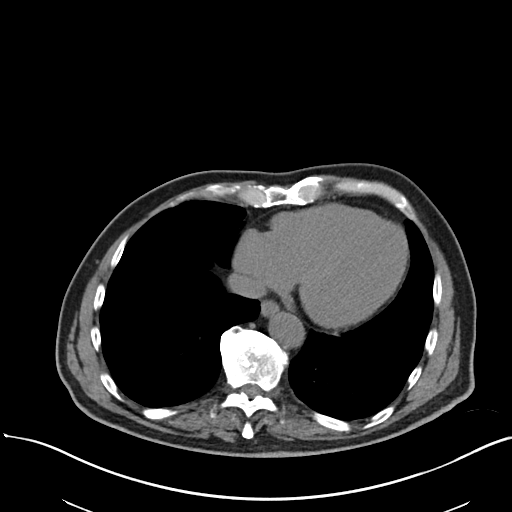

[Series 6: cor st · coronal · 0.82mm/px · 3 of 95 slices shown]
[im 32/95  soft-tissue]
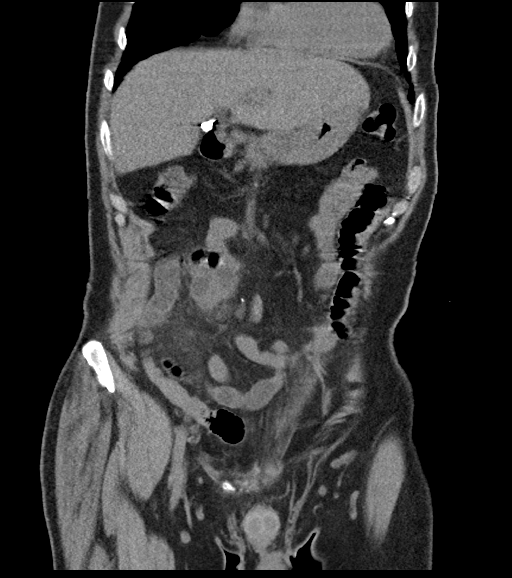
[im 42/95  soft-tissue]
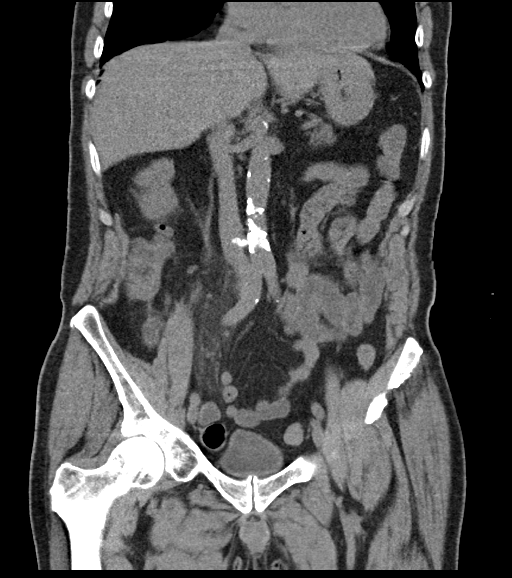
[im 53/95  soft-tissue]
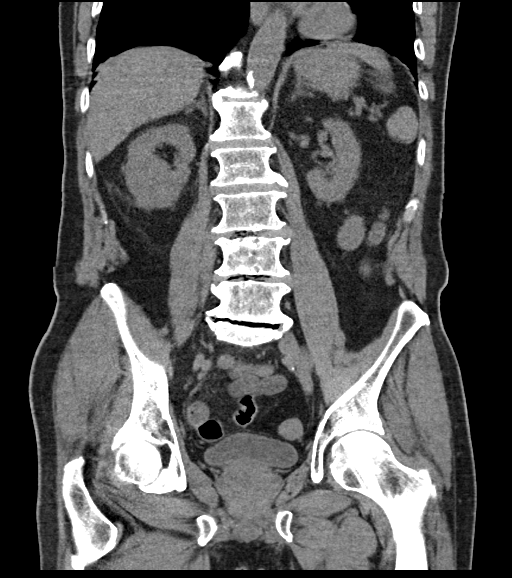

[16 of 46 positions shown; findings below may reference images not displayed]

FINDINGS: Lower chest: No acute abnormality.

Hepatobiliary: No focal liver abnormality is seen. Status post
cholecystectomy. No biliary dilatation.

Pancreas: Unremarkable. No pancreatic ductal dilatation or
surrounding inflammatory changes.

Spleen: Normal in size without focal abnormality.

Adrenals/Urinary Tract: Adrenal glands are unremarkable. Kidneys are
normal, without renal calculi, focal lesion, or hydronephrosis.
Bladder is unremarkable.

Stomach/Bowel: Stomach is within normal limits. Appendix appears
normal. There is no evidence of bowel obstruction. However,
mesenteric inflammatory changes are noted in the right lower
quadrant with mildly enlarged lymph nodes present. There does appear
to be mild dilatation of adjacent small bowel loops, and some form
of ileal or jejunal diverticulitis may be present. Meckel's
diverticulum may be a possibility.

Vascular/Lymphatic: Atherosclerosis of abdominal aorta is noted
without aneurysm formation.

Reproductive: Prostate is unremarkable.

Other: No abdominal wall hernia or abnormality. No abdominopelvic
ascites.

Musculoskeletal: No acute or significant osseous findings.
IMPRESSION: 1. Mesenteric inflammatory changes are noted in the right lower
quadrant with mildly enlarged lymph nodes present. There does appear
to be mild dilatation of adjacent small bowel loops, and some form
of ileal or jejunal diverticulitis may be present. Meckel's
diverticulum may be a possibility.
2. Aortic atherosclerosis.

Aortic Atherosclerosis ([id number]).

## 2021-12-05 IMAGING — US US RENAL
1 series · 14 of 25 positions shown · non-contrast
Comparison: None.

CLINICAL DATA: Acute kidney injury

EXAM:
RENAL / URINARY TRACT ULTRASOUND COMPLETE

[Series 1: us renal · 14 of 40 slices shown]
[im 1/40]
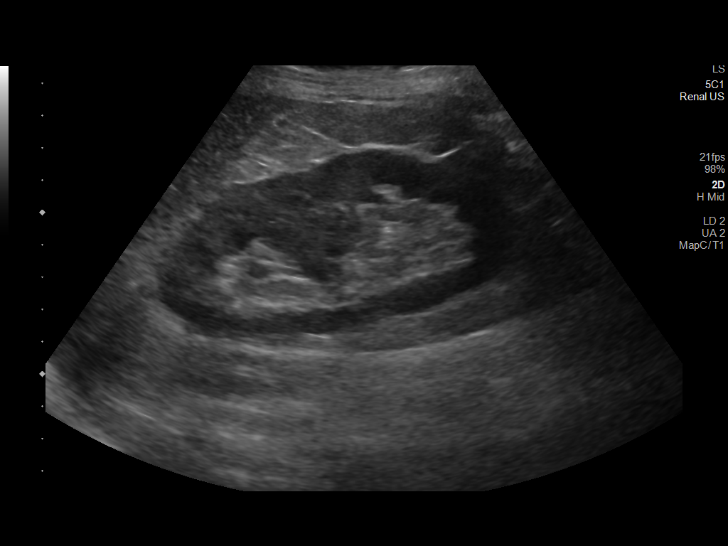
[im 4/40]
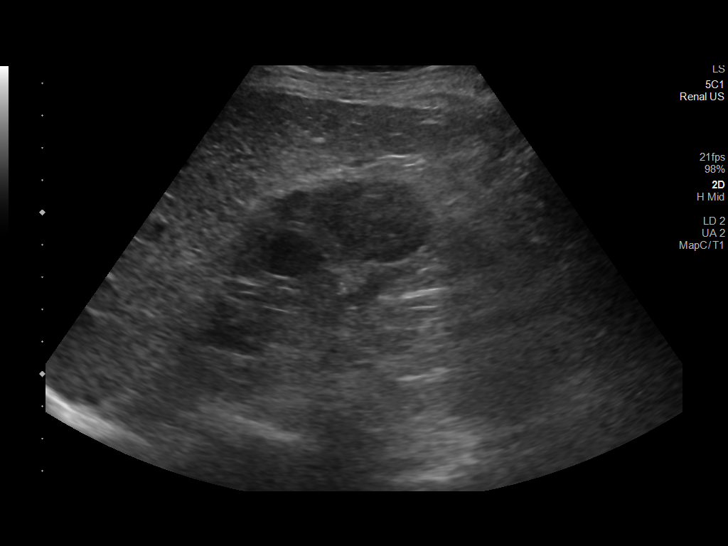
[im 7/40]
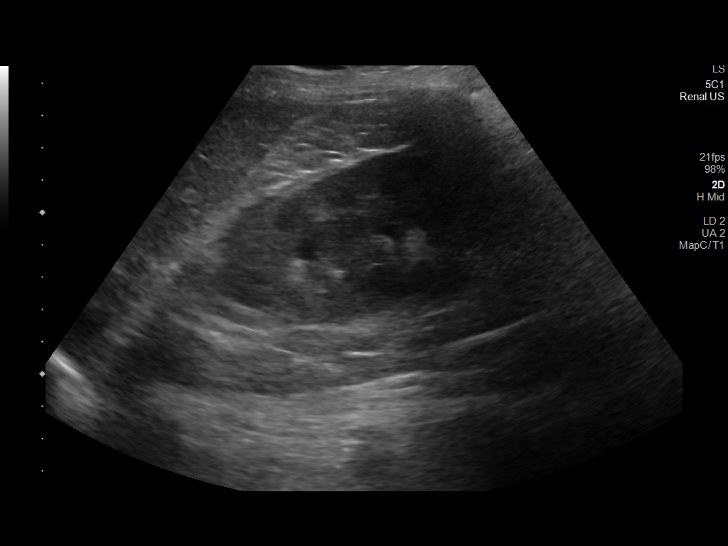
[im 10/40]
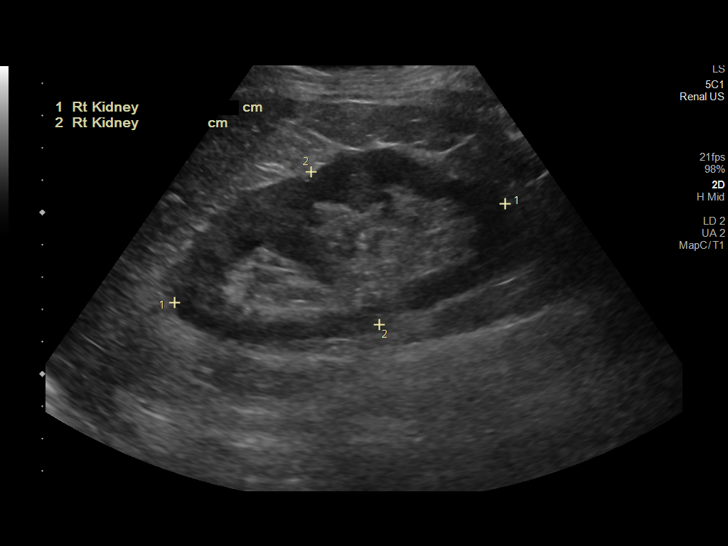
[im 14/40]
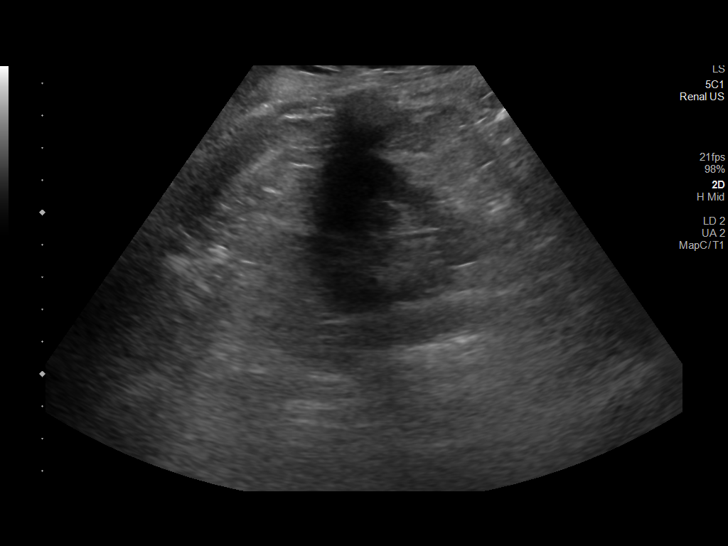
[im 15/40]
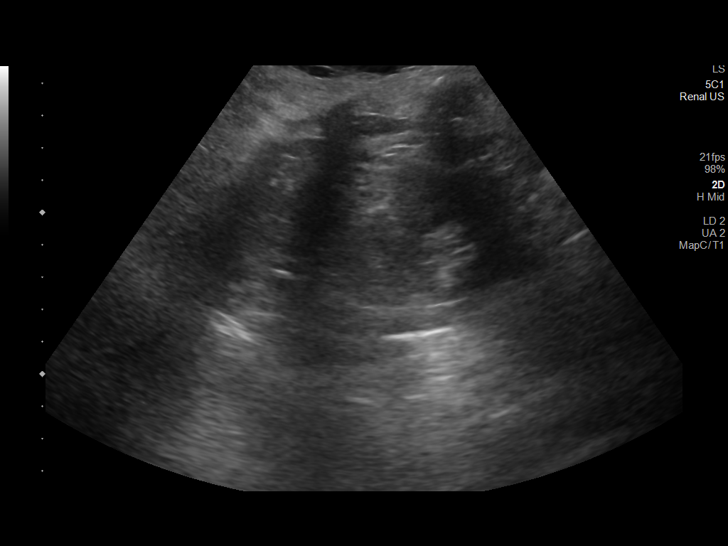
[im 18/40]
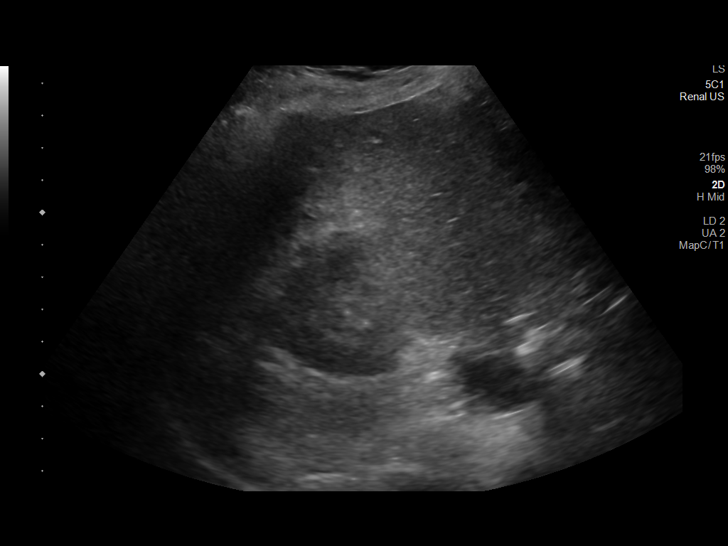
[im 22/40]
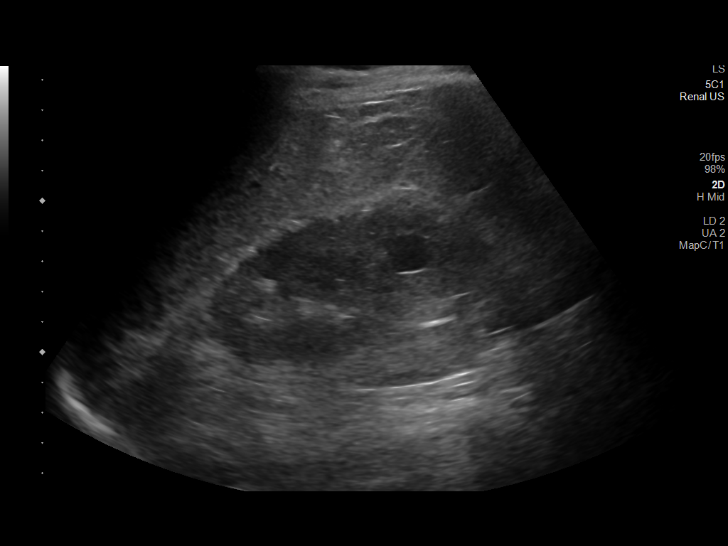
[im 25/40]
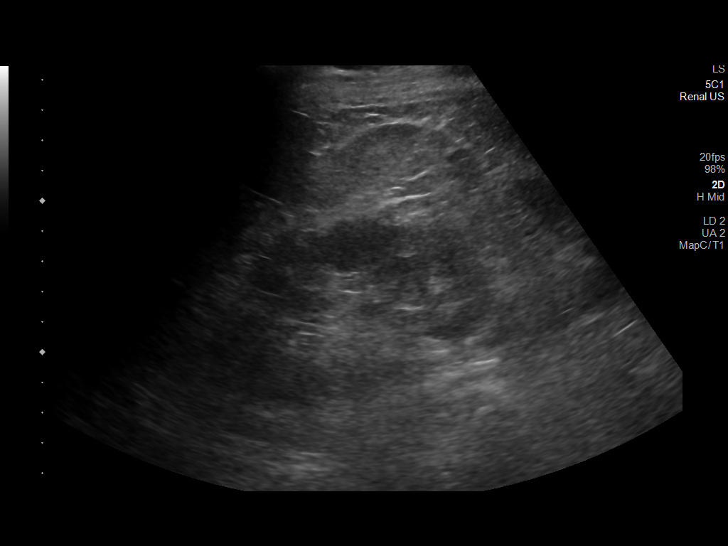
[im 27/40]
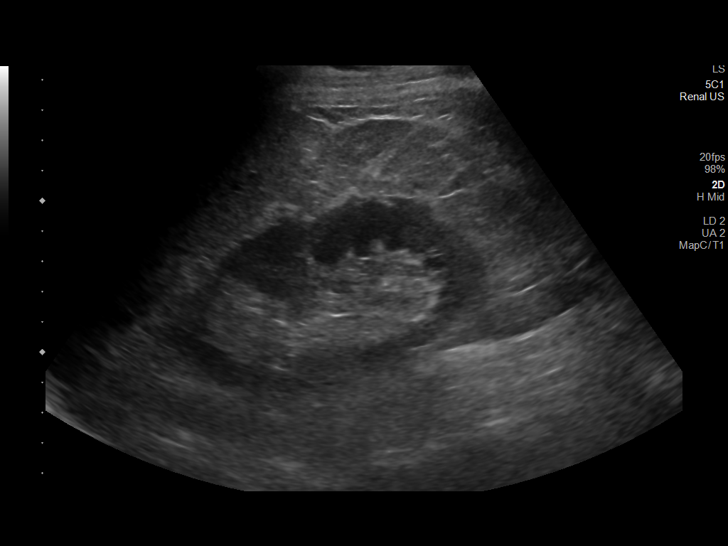
[im 30/40]
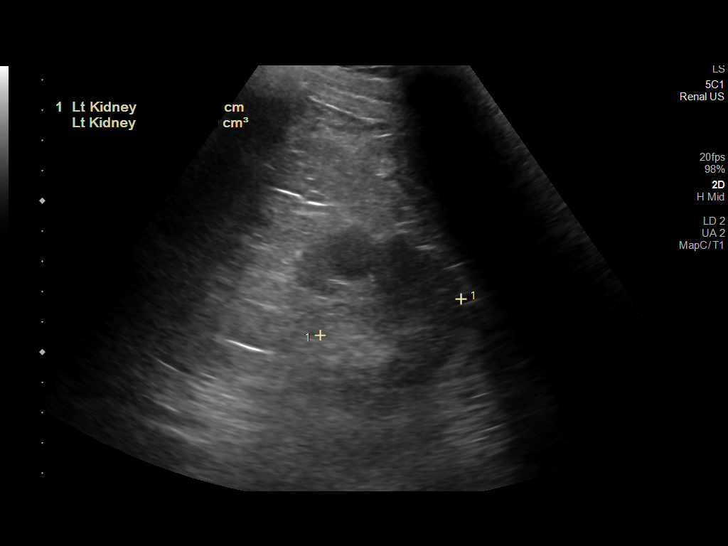
[im 33/40]
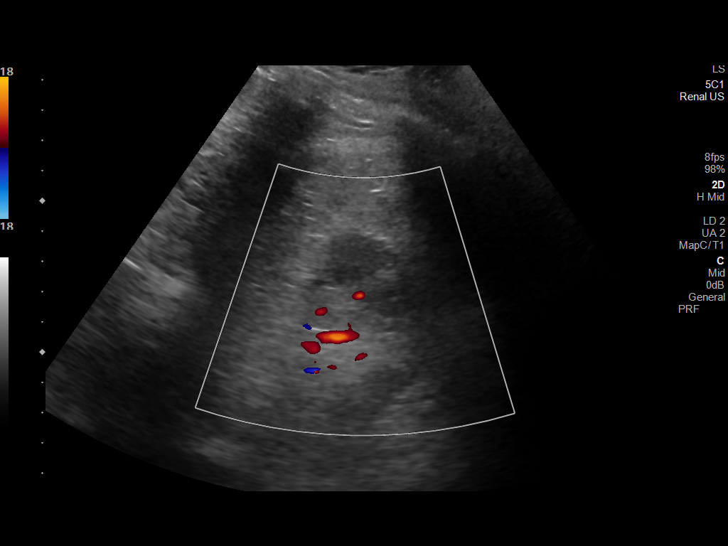
[im 36/40]
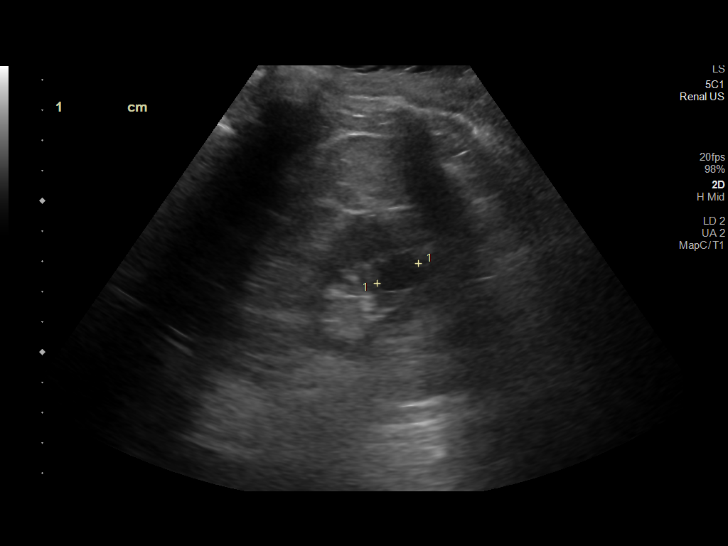
[im 40/40]
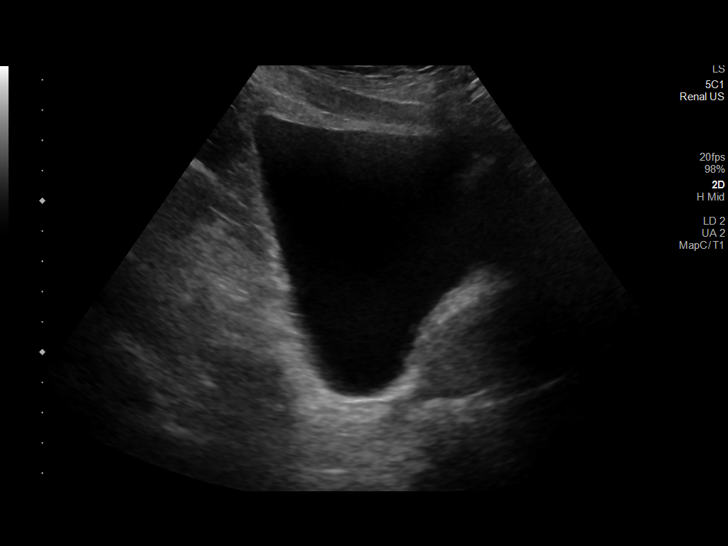

[14 of 25 positions shown; findings below may reference images not displayed]

FINDINGS: Right Kidney:

Renal measurements: 10.7 x 5.2 x 4.0 cm = volume: 115 mL.
Echogenicity within normal limits. No mass or hydronephrosis
visualized.

Left Kidney:

Renal measurements: 10.5 x 5.2 x 4.8 cm = volume: 136 mL.
Echogenicity within normal limits. Within the lower pole of the left
kidney there is a small anechoic cyst measuring 1.3 x 1.3 cm.

Bladder:

Appears normal for degree of bladder distention.

Other:

None.
IMPRESSION: Normal appearing right kidney.

Left renal cyst.
